# Patient Record
Sex: Male | Born: 1935 | Race: White | Hispanic: No | Smoking: Never smoker
Health system: Southern US, Community
[De-identification: ages and names within clinical notes are randomized; demographics above are authoritative.]

## PROBLEM LIST (undated history)

## (undated) DIAGNOSIS — E119 Type 2 diabetes mellitus without complications: Secondary | ICD-10-CM

## (undated) DIAGNOSIS — I739 Peripheral vascular disease, unspecified: Secondary | ICD-10-CM

## (undated) DIAGNOSIS — N289 Disorder of kidney and ureter, unspecified: Secondary | ICD-10-CM

## (undated) DIAGNOSIS — I10 Essential (primary) hypertension: Secondary | ICD-10-CM

## (undated) DIAGNOSIS — N184 Chronic kidney disease, stage 4 (severe): Secondary | ICD-10-CM

## (undated) HISTORY — PX: OTHER SURGICAL HISTORY: SHX169

---

## 2015-11-23 ENCOUNTER — Ambulatory Visit: Payer: Medicare Other

## 2015-11-23 ENCOUNTER — Encounter: Payer: Self-pay | Admitting: *Deleted

## 2015-11-23 ENCOUNTER — Emergency Department: Payer: Medicare Other

## 2015-11-23 ENCOUNTER — Emergency Department
Admission: EM | Admit: 2015-11-23 | Discharge: 2015-11-23 | Disposition: A | Discharge status: Home / Self Care | Payer: Medicare Other | Attending: Student | Admitting: Student

## 2015-11-23 DIAGNOSIS — R197 Diarrhea, unspecified: Secondary | ICD-10-CM | POA: Diagnosis not present

## 2015-11-23 DIAGNOSIS — Z7984 Long term (current) use of oral hypoglycemic drugs: Secondary | ICD-10-CM | POA: Diagnosis not present

## 2015-11-23 DIAGNOSIS — Z79899 Other long term (current) drug therapy: Secondary | ICD-10-CM | POA: Diagnosis not present

## 2015-11-23 DIAGNOSIS — Z794 Long term (current) use of insulin: Secondary | ICD-10-CM | POA: Insufficient documentation

## 2015-11-23 DIAGNOSIS — R109 Unspecified abdominal pain: Secondary | ICD-10-CM | POA: Diagnosis present

## 2015-11-23 DIAGNOSIS — R112 Nausea with vomiting, unspecified: Secondary | ICD-10-CM | POA: Insufficient documentation

## 2015-11-23 DIAGNOSIS — I10 Essential (primary) hypertension: Secondary | ICD-10-CM | POA: Insufficient documentation

## 2015-11-23 DIAGNOSIS — I739 Peripheral vascular disease, unspecified: Secondary | ICD-10-CM | POA: Insufficient documentation

## 2015-11-23 DIAGNOSIS — E119 Type 2 diabetes mellitus without complications: Secondary | ICD-10-CM | POA: Diagnosis not present

## 2015-11-23 DIAGNOSIS — R1110 Vomiting, unspecified: Secondary | ICD-10-CM

## 2015-11-23 HISTORY — DX: Type 2 diabetes mellitus without complications: E11.9

## 2015-11-23 HISTORY — DX: Essential (primary) hypertension: I10

## 2015-11-23 HISTORY — DX: Disorder of kidney and ureter, unspecified: N28.9

## 2015-11-23 LAB — URINALYSIS COMPLETE WITH MICROSCOPIC (ARMC ONLY)
Bilirubin Urine: NEGATIVE
GLUCOSE, UA: 50 mg/dL — AB
Ketones, ur: NEGATIVE mg/dL
LEUKOCYTES UA: NEGATIVE
Nitrite: NEGATIVE
Protein, ur: >500 mg/dL — AB
SQUAMOUS EPITHELIAL / LPF: NONE SEEN
Specific Gravity, Urine: 1.013 (ref 1.005–1.030)
pH: 5 (ref 5.0–8.0)

## 2015-11-23 LAB — CBC WITH DIFFERENTIAL/PLATELET
BASOS ABS: 0 10*3/uL (ref 0–0.1)
Basophils Relative: 0 %
Eosinophils Absolute: 0.2 10*3/uL (ref 0–0.7)
Eosinophils Relative: 2 %
HEMATOCRIT: 30.2 % — AB (ref 40.0–52.0)
Hemoglobin: 10 g/dL — ABNORMAL LOW (ref 13.0–18.0)
LYMPHS ABS: 1.3 10*3/uL (ref 1.0–3.6)
MCH: 29.2 pg (ref 26.0–34.0)
MCHC: 33 g/dL (ref 32.0–36.0)
MCV: 88.4 fL (ref 80.0–100.0)
MONO ABS: 1.7 10*3/uL — AB (ref 0.2–1.0)
Monocytes Relative: 13 %
NEUTROS ABS: 9.5 10*3/uL — AB (ref 1.4–6.5)
Neutrophils Relative %: 75 %
Platelets: 353 10*3/uL (ref 150–440)
RBC: 3.41 MIL/uL — AB (ref 4.40–5.90)
RDW: 12.8 % (ref 11.5–14.5)
WBC: 12.8 10*3/uL — AB (ref 3.8–10.6)

## 2015-11-23 LAB — COMPREHENSIVE METABOLIC PANEL
ALT: 13 U/L — AB (ref 17–63)
AST: 21 U/L (ref 15–41)
Albumin: 3 g/dL — ABNORMAL LOW (ref 3.5–5.0)
Alkaline Phosphatase: 69 U/L (ref 38–126)
Anion gap: 9 (ref 5–15)
BILIRUBIN TOTAL: 0.4 mg/dL (ref 0.3–1.2)
BUN: 46 mg/dL — AB (ref 6–20)
CALCIUM: 7.4 mg/dL — AB (ref 8.9–10.3)
CO2: 21 mmol/L — ABNORMAL LOW (ref 22–32)
CREATININE: 3.01 mg/dL — AB (ref 0.61–1.24)
Chloride: 112 mmol/L — ABNORMAL HIGH (ref 101–111)
GFR calc Af Amer: 20 mL/min — ABNORMAL LOW (ref >60)
GFR, EST NON AFRICAN AMERICAN: 17 mL/min — AB (ref >60)
Glucose, Bld: 72 mg/dL (ref 65–99)
Potassium: 4.1 mmol/L (ref 3.5–5.1)
Sodium: 142 mmol/L (ref 135–145)
TOTAL PROTEIN: 6.3 g/dL — AB (ref 6.5–8.1)

## 2015-11-23 LAB — LIPASE, BLOOD: LIPASE: 34 U/L (ref 11–51)

## 2015-11-23 LAB — TROPONIN I: Troponin I: <0.03 ng/mL (ref <0.031)

## 2015-11-23 MED ORDER — ONDANSETRON HCL 4 MG/2ML IJ SOLN
4.0000 mg | Freq: Once | INTRAMUSCULAR | Status: AC
  Administered 2015-11-23: 4 mg via INTRAVENOUS
  Filled 2015-11-23: qty 2

## 2015-11-23 MED ORDER — SODIUM CHLORIDE 0.9 % IV BOLUS (SEPSIS)
500.0000 mL | Freq: Once | INTRAVENOUS | Status: AC
  Administered 2015-11-23: 500 mL via INTRAVENOUS

## 2015-11-23 MED ORDER — DIATRIZOATE MEGLUMINE & SODIUM 66-10 % PO SOLN
30.0000 mL | Freq: Once | ORAL | Status: AC
  Administered 2015-11-23: 30 mL via ORAL

## 2015-11-23 MED ORDER — ONDANSETRON 4 MG PO TBDP: 4.0000 mg | ORAL_TABLET | Freq: Three times a day (TID) | ORAL | Status: DC | PRN

## 2015-11-23 NOTE — ED Notes (Signed)
Pt [facility]. Pt made aware and verbalized understanding. [facility] contacted and made aware.

## 2015-11-23 NOTE — ED Provider Notes (Signed)
[facility] Emergency Department Provider Note   ____________________________________________  Time seen: Approximately 5:15 PM  I have reviewed the triage vital signs and the nursing notes.   HISTORY  Chief Complaint Diarrhea and Emesis    HPI Rodney Valencia is a 82 y.o. male with diabetes, hypertension, coronary artery disease, stage IV chronic kidney disease who presents for evaluation of recurrent nonbloody nonbilious emesis and nonbloody diarrhea today, gradual onset, constant, no modifying factors, currently severe. He has had some abdominal cramping. No chest pain or difficulty breathing, no fevers. No dysuria. No coughing, sneezing, runny nose, congestion.   Past Medical History  Diagnosis Date  . Diabetes mellitus without complication (HCC)   . Hypertension   . Renal disorder     There are no active problems to display for this patient.   History reviewed. No pertinent past surgical history.  Current Outpatient Rx  Name  Route  Sig  Dispense  Refill  . amLODipine (NORVASC) 10 MG tablet   Oral   Take 10 mg by mouth daily.         Marland Kitchen. atorvastatin (LIPITOR) 10 MG tablet   Oral   Take 10 mg by mouth at bedtime.         . cholecalciferol (VITAMIN D) 1000 units tablet   Oral   Take 1,000 Units by mouth daily.         Marland Kitchen. docusate sodium (COLACE) 100 MG capsule   Oral   Take 100 mg by mouth 2 (two) times daily.         Marland Kitchen. gabapentin (NEURONTIN) 100 MG capsule   Oral   Take 200 mg by mouth 2 (two) times daily.         Marland Kitchen. glipiZIDE (GLUCOTROL XL) 10 MG 24 hr tablet   Oral   Take 10 mg by mouth 2 (two) times daily.         . insulin glargine (LANTUS) 100 UNIT/ML injection   Subcutaneous   Inject 5 Units into the skin daily.         . meclizine (ANTIVERT) 25 MG tablet   Oral   Take 25 mg by mouth every 8 (eight) hours as needed for dizziness.         . metoprolol succinate (TOPROL-XL) 25 MG 24 hr tablet   Oral  Take 25 mg by mouth daily.         Marland Kitchen. tolterodine (DETROL) 2 MG tablet   Oral   Take 2 mg by mouth 2 (two) times daily.         Marland Kitchen. zolpidem (AMBIEN) 10 MG tablet   Oral   Take 5 mg by mouth at bedtime as needed for sleep.           Allergies Review of patient's allergies indicates no known allergies.  History reviewed. No pertinent family history.  Social History Social History  Substance Use Topics  . Smoking status: Never Smoker   . Smokeless tobacco: None  . Alcohol Use: No    Review of Systems Constitutional: No fever/chills Eyes: No visual changes. ENT: No sore throat. Cardiovascular: Denies chest pain. Respiratory: Denies shortness of breath. Gastrointestinal: + abdominal cramping.  + nausea, + vomiting.  + diarrhea.  No constipation. Genitourinary: Negative for dysuria. Musculoskeletal: Negative for back pain. Skin: Negative for rash. Neurological: Negative for headaches, focal weakness or numbness.  10-point ROS otherwise negative.  ____________________________________________   PHYSICAL EXAM:  Filed Vitals:   11/23/15 1705 11/23/15  1904 11/23/15 2036  BP: 159/74 148/68 137/55  Pulse: 67 71 68  Temp: 99.9 F (37.7 C)    TempSrc: Oral    Resp: SpO2: 95% 98% 96%    VITAL SIGNS: ED Triage Vitals  Enc Vitals Group     BP --      Pulse --      Resp --      Temp --      Temp src --      SpO2 --      Weight --      Height --      Head Cir --      Peak Flow --      Pain Score --      Pain Loc --      Pain Edu? --      Excl. in GC? --     Constitutional: Alert and oriented. Nontoxic appearing and in no acute distress. Eyes: Conjunctivae are normal. PERRL. EOMI. Head: Atraumatic. Nose: No congestion/rhinnorhea. Mouth/Throat: Mucous membranes are moist.  Oropharynx non-erythematous. Neck: No stridor. Supple without meningismus. Cardiovascular: Normal rate, regular rhythm. Grossly normal heart sounds.  Good peripheral  circulation. Respiratory: Normal respiratory effort.  No retractions. Lungs CTAB. Gastrointestinal: Soft and nontender. No distention.  No CVA tenderness. Genitourinary: deferred Musculoskeletal: No lower extremity tenderness nor edema.  No joint effusions. Neurologic:  Normal speech and language. No gross focal neurologic deficits are appreciated. Skin:  Skin is warm, dry and intact. No rash noted. Psychiatric: Mood and affect are normal. Speech and behavior are normal.  ____________________________________________   LABS (all labs ordered are listed, but only abnormal results are displayed)  Labs Reviewed  CBC WITH DIFFERENTIAL/PLATELET - Abnormal; Notable for the following:    WBC 12.8 (*)    RBC 3.41 (*)    Hemoglobin 10.0 (*)    HCT 30.2 (*)    Neutro Abs 9.5 (*)    Monocytes Absolute 1.7 (*)    All other components within normal limits  COMPREHENSIVE METABOLIC PANEL - Abnormal; Notable for the following:    Chloride 112 (*)    CO2 21 (*)    BUN 46 (*)    Creatinine, Ser 3.01 (*)    Calcium 7.4 (*)    Total Protein 6.3 (*)    Albumin 3.0 (*)    ALT 13 (*)    GFR calc non Af Amer 17 (*)    GFR calc Af Amer 20 (*)    All other components within normal limits  URINALYSIS COMPLETEWITH MICROSCOPIC ([facility] ONLY) - Abnormal; Notable for the following:    Color, Urine YELLOW (*)    APPearance CLEAR (*)    Glucose, UA 50 (*)    Hgb urine dipstick 1+ (*)    Protein, ur >500 (*)    Bacteria, UA FEW (*)    All other components within normal limits  LIPASE, BLOOD  TROPONIN I   ____________________________________________  EKG  ED ECG REPORT I, Gayla Doss, the attending physician, personally viewed and interpreted this ECG.   Date: 11/23/2015  EKG Time: 17:26  Rate: 67  Rhythm: Normal sinus rhythm  Axis: noemal  Intervals:none  ST&T Change: No acute ST elevation. LVH with repolarization  abnormality.  ____________________________________________  RADIOLOGY  CT abdomen and pelvis IMPRESSION: No acute abnormality abdomen or pelvis. No finding to explain the patient's symptoms.  Extensive calcific aortic and coronary atherosclerosis.  Small gravel type gallstones without evidence of  cholecystitis.  Mild diverticulosis without diverticulitis.  Findings consistent with old granulomatous disease. ____________________________________________   PROCEDURES  Procedure(s) performed: None  Critical Care performed: No  ____________________________________________   INITIAL IMPRESSION / ASSESSMENT AND PLAN / ED COURSE  Pertinent labs & imaging results that were available during my care of the patient were reviewed by me and considered in my medical decision making (see chart for details).  Rodney Valencia is a 82 y.o. male with diabetes, hypertension, coronary artery disease, stage IV chronic kidney disease who presents for evaluation of recurrent nonbloody nonbilious emesis and nonbloody diarrhea today. On exam, he is nontoxic appearing and in no acute distress. ABLE to really is afebrile. He has a benign abdominal examination. Symptoms may be secondary to viral gastroenteritis however given his advanced age, we'll obtain screening labs, urinalysis, likely CT of the abdomen and pelvis. We'll treat him symptomatically with IV fluids and antiemetics.  ----------------------------------------- 8:39 PM on 11/23/2015 ----------------------------------------- Labs reviewed, CBC was notable for mild leukocytosis, mild anemia with hemoglobin 10.0. No prior labs are available for comparison. CMP shows creatinine elevation at 3.01 however the patient does have known stage IV kidney disease and I suspect this is chronic. His corrected calcium accounting for his albumin is 8.2. Normal lipase, negative troponin. Urinalysis is not consistent with infection. He reports he feels well, and  is requesting discharge. CT of the abdomen and pelvis shows no acute abnormality. DC with return precautions and close PCP follow-up. He is comfortable with the discharge plan and is tolerating by mouth intake without vomiting in the emergency department ____________________________________________   FINAL CLINICAL IMPRESSION(S) / ED DIAGNOSES  Final diagnoses:  Vomiting and diarrhea      NEW MEDICATIONS STARTED DURING THIS VISIT:  New Prescriptions   No medications on file     Note:  This document was prepared using Dragon voice recognition software and may include unintentional dictation errors.    Gayla Doss, MD 11/23/15 2106

## 2015-11-23 NOTE — ED Notes (Signed)
Pt returned from CT at this time.  

## 2015-11-23 NOTE — ED Notes (Signed)
Pt cleaned up, given new brief and new pants. Dressed and awaiting on EMS transport home to [facility]. NAD noted, verbalized no further needs at this time

## 2015-11-23 NOTE — ED Notes (Signed)
[facility] with n/v/d that began x 2 hours ago. Pt denies any pain, SOB, or chest pain. Upon arrival, pt AAOx4, vitals stable, NAD noted. MD Inocencio HomesGayle at bedside upon arrival.

## 2015-11-23 NOTE — ED Notes (Signed)
EMS arrived at this time for pt transport. Pt stable, NAD noted.

## 2015-11-23 NOTE — ED Notes (Signed)
MD Gayle at bedside. 

## 2015-11-23 NOTE — ED Notes (Signed)
[facility] contacted, unable to provide transport back to facility. Will require EMS.

## 2015-11-26 ENCOUNTER — Emergency Department: Payer: Medicare Other

## 2015-11-26 ENCOUNTER — Emergency Department
Admission: EM | Admit: 2015-11-26 | Discharge: 2015-11-26 | Disposition: A | Discharge status: Home / Self Care | Payer: Medicare Other | Attending: Emergency Medicine | Admitting: Emergency Medicine

## 2015-11-26 ENCOUNTER — Encounter: Payer: Self-pay | Admitting: Emergency Medicine

## 2015-11-26 DIAGNOSIS — M79602 Pain in left arm: Secondary | ICD-10-CM | POA: Diagnosis present

## 2015-11-26 DIAGNOSIS — Z79899 Other long term (current) drug therapy: Secondary | ICD-10-CM | POA: Insufficient documentation

## 2015-11-26 DIAGNOSIS — S0093XA Contusion of unspecified part of head, initial encounter: Secondary | ICD-10-CM | POA: Insufficient documentation

## 2015-11-26 DIAGNOSIS — Z794 Long term (current) use of insulin: Secondary | ICD-10-CM | POA: Insufficient documentation

## 2015-11-26 DIAGNOSIS — E119 Type 2 diabetes mellitus without complications: Secondary | ICD-10-CM | POA: Insufficient documentation

## 2015-11-26 DIAGNOSIS — S0990XA Unspecified injury of head, initial encounter: Secondary | ICD-10-CM | POA: Diagnosis not present

## 2015-11-26 DIAGNOSIS — Y929 Unspecified place or not applicable: Secondary | ICD-10-CM | POA: Insufficient documentation

## 2015-11-26 DIAGNOSIS — Z23 Encounter for immunization: Secondary | ICD-10-CM | POA: Insufficient documentation

## 2015-11-26 DIAGNOSIS — W01198A Fall on same level from slipping, tripping and stumbling with subsequent striking against other object, initial encounter: Secondary | ICD-10-CM | POA: Diagnosis not present

## 2015-11-26 DIAGNOSIS — I10 Essential (primary) hypertension: Secondary | ICD-10-CM | POA: Diagnosis not present

## 2015-11-26 DIAGNOSIS — T148XXA Other injury of unspecified body region, initial encounter: Secondary | ICD-10-CM

## 2015-11-26 DIAGNOSIS — Y9389 Activity, other specified: Secondary | ICD-10-CM | POA: Diagnosis not present

## 2015-11-26 DIAGNOSIS — Y999 Unspecified external cause status: Secondary | ICD-10-CM | POA: Diagnosis not present

## 2015-11-26 HISTORY — DX: Peripheral vascular disease, unspecified: I73.9

## 2015-11-26 MED ORDER — BACITRACIN ZINC 500 UNIT/GM EX OINT
TOPICAL_OINTMENT | CUTANEOUS | Status: AC | PRN
  Administered 2015-11-26: 1 via TOPICAL

## 2015-11-26 MED ORDER — BACITRACIN ZINC 500 UNIT/GM EX OINT
TOPICAL_OINTMENT | CUTANEOUS | Status: AC
  Administered 2015-11-26: 1 via TOPICAL
  Filled 2015-11-26: qty 0.9

## 2015-11-26 MED ORDER — TETANUS-DIPHTH-ACELL PERTUSSIS 5-2.5-18.5 LF-MCG/0.5 IM SUSP
0.5000 mL | Freq: Once | INTRAMUSCULAR | Status: AC
  Administered 2015-11-26: 0.5 mL via INTRAMUSCULAR
  Filled 2015-11-26: qty 0.5

## 2015-11-26 NOTE — ED Notes (Signed)
[facility] present to [facility].

## 2015-11-26 NOTE — ED Notes (Addendum)
89 yom PMHx DM, HTN, CKD (No HD), PVD presents via EMS for c/c Left arm and back pain after a ground level fall yesterday. States he was eating dinner, went to get up from the table and his foot  'got snagged on a chair' causing him to trip and fall. NO LOC.   [facility] [phone]

## 2015-11-26 NOTE — ED Notes (Deleted)
Discussed discharge instructions and follow-up care with patient. No questions or concerns at this time. Pt stable at discharge. [facility].

## 2015-11-26 NOTE — ED Provider Notes (Signed)
[facility] Emergency Department Provider Note   ____________________________________________  Time seen: Approximately 11:50 AM  I have reviewed the triage vital signs and the nursing notes.   HISTORY  Chief Complaint Fall   HPI Rodney Valencia is a 82 y.o. male patient reports she was getting up from dinner table tripped and fell when he caught his foot. He said he fell and hit his head on the wall and his head hurts and his neck hurts back hurts his left elbow hurts and his knee hurts also on the left. On examination the worst pain seems to be in the patient's knee. Patient denies any other medical problems and no his past history shows hypertension diabetes and peripheral vascular disease. Patient is very sparse in his words use pain appears to be moderate.   Past Medical History  Diagnosis Date  . Diabetes mellitus without complication (HCC)   . Hypertension   . Renal disorder   . PVD (peripheral vascular disease) (HCC)     There are no active problems to display for this patient.   History reviewed. No pertinent past surgical history.  Current Outpatient Rx  Name  Route  Sig  Dispense  Refill  . amLODipine (NORVASC) 10 MG tablet   Oral   Take 10 mg by mouth daily.         Marland Kitchen atorvastatin (LIPITOR) 10 MG tablet   Oral   Take 10 mg by mouth at bedtime.         . cholecalciferol (VITAMIN D) 1000 units tablet   Oral   Take 1,000 Units by mouth daily.         Marland Kitchen docusate sodium (COLACE) 100 MG capsule   Oral   Take 100 mg by mouth 2 (two) times daily.         Marland Kitchen gabapentin (NEURONTIN) 100 MG capsule   Oral   Take 200 mg by mouth 2 (two) times daily.         Marland Kitchen glipiZIDE (GLUCOTROL XL) 10 MG 24 hr tablet   Oral   Take 10 mg by mouth 2 (two) times daily.         . insulin glargine (LANTUS) 100 UNIT/ML injection   Subcutaneous   Inject 5 Units into the skin daily.         . meclizine (ANTIVERT) 25 MG tablet   Oral   Take  25 mg by mouth every 8 (eight) hours as needed for dizziness.         . metoprolol succinate (TOPROL-XL) 25 MG 24 hr tablet   Oral   Take 25 mg by mouth daily.         . ondansetron (ZOFRAN ODT) 4 MG disintegrating tablet   Oral   Take 1 tablet (4 mg total) by mouth every 8 (eight) hours as needed for nausea or vomiting.   6 tablet   0   . tolterodine (DETROL) 2 MG tablet   Oral   Take 2 mg by mouth 2 (two) times daily.         Marland Kitchen zolpidem (AMBIEN) 10 MG tablet   Oral   Take 5 mg by mouth at bedtime as needed for sleep.           Allergies Review of patient's allergies indicates no known allergies.  History reviewed. No pertinent family history.  Social History Social History  Substance Use Topics  . Smoking status: Never Smoker   . Smokeless tobacco: None  .  Alcohol Use: No    Review of Systems Constitutional: No fever/chills Eyes: No visual changes. ENT: No sore throat. Cardiovascular: Denies chest pain. Respiratory: Denies shortness of breath. Gastrointestinal: No abdominal pain.  No nausea, no vomiting.  No diarrhea.  No constipation. Genitourinary: Negative for dysuria. Musculoskeletal: Negative for back pain. Skin: Negative for rash. Neurological: Negative for headaches, focal weakness or numbness.  10-point ROS otherwise negative.  ____________________________________________   PHYSICAL EXAM:  VITAL SIGNS: ED Triage Vitals  Enc Vitals Group     BP 11/26/15 1043 156/60 mmHg     Pulse Rate 11/26/15 1043 73     Resp 11/26/15 1043 18     Temp 11/26/15 1043 98.4 F (36.9 C)     Temp Source 11/26/15 1043 Oral     SpO2 11/26/15 1043 97 %     Weight 11/26/15 1044 159 lb (72.122 kg)     Height 11/26/15 1044  (1.778 m)     Head Cir --      Peak Flow --      Pain Score 11/26/15 1043 5     Pain Loc --      Pain Edu? --      Excl. in GC? --     Constitutional: Alert and oriented. Well appearing and in no acute distress. Eyes:  Conjunctivae are normal. PERRL. EOMI. Head: Atraumatic. Nose: No congestion/rhinnorhea. Mouth/Throat: Mucous membranes are moist.  Oropharynx non-erythematous. Neck: No stridor.  Mild diffuse cervical spine tenderness to palpation Cardiovascular: Normal rate, regular rhythm. Grossly normal heart sounds.  Good peripheral circulation. Respiratory: Normal respiratory effort.  No retractions. Lungs CTAB. Gastrointestinal: Soft and nontender. No distention. No abdominal bruits. No CVA tenderness. Musculoskeletal: Left knee is tender and patient complains of pain when I attempt to move it however it is not bruised or swollen there is no crepitus everything seems to be in the proper position. Patient also complains of pain in the left arm but he is using the left arm to hold his drink and moves it freely without any difficulty. She complains of pain on palpation of his spine more in the lower part of the spine and lumbar spine Neurologic:  Normal speech and language. No gross focal neurologic deficits are appreciated.  Skin:  Skin is warm, dry and intact. No rash noted. Psychiatric: Mood and affect are normal. Speech and behavior are normal.  ____________________________________________   LABS (all labs ordered are listed, but only abnormal results are displayed)  Labs Reviewed - No data to display ____________________________________________  EKG   ____________________________________________  RADIOLOGY  EXAM: CT HEAD WITHOUT CONTRAST  CT CERVICAL SPINE WITHOUT CONTRAST  TECHNIQUE: Multidetector CT imaging of the head and cervical spine was performed following the standard protocol without intravenous contrast. Multiplanar CT image reconstructions of the cervical spine were also generated.  COMPARISON: None.  FINDINGS: CT HEAD FINDINGS  There is opacification of the superior right maxillary sinus and mild mucosal thickening in the superior left maxillary sinus.  Mild mucosal thickening in the ethmoid sinuses as well. No air-fluid levels or other sinus disease identified. Mastoid air cells and middle ears are well aerated. The right mandibular head sits somewhat laterally at the TMJ joint relative to the left. This could be positioning versus subluxation. Recommend clinical correlation new this region. Remainder of the bones are within normal limits. Extracranial soft tissues are within normal limits. No subdural, epidural, or subarachnoid hemorrhage identified. Ventricles and sulci are prominent, likely due to atrophy. No mass,  mass effect, or midline shift. Scattered white matter changes are seen. A subcortical lacunar infarct is seen in the posterior right corona radiata on image 22. No acute cortical ischemia or infarct is identified on today's study. The cerebellum, brainstem, and basal cisterns are normal.  CT CERVICAL SPINE FINDINGS  There is 2 mm of anterior listhesis of C2 versus C3. No other significant malalignment. No prevertebral soft tissue swelling. The pre odontoid space is normal. No fractures are seen. Multilevel degenerative changes are seen. There is fusion of the posterior C4 and C5 vertebral bodies. There is osteophytosis at multiple disc levels with a posterior osteophytes seen at C4-5 and another seen at C6-7. Degenerative changes are also seen in the facets. There is a prominent posterior disc bulge that C5-6 with flattening of the thecal sac and narrowing of the canal to 10 mm. No other abnormalities.  IMPRESSION: 1. No acute intracranial process. 2. 2 mm of anterior listhesis of C2 versus C3 with no underlying fracture identified. This is favored to be due to the facet degenerative changes at this level. 3. Prominent disc bulge at C5-6 with flattening of the anterior thecal sac and narrowing of the canal to 10 mm. 4. The right mandibular head is mildly lateral compared to the TMJ. Whether this is positional  or due to mild subluxation is unclear on provided images. Recommend clinical correlation. 5. Mild sinus disease.   Electronically Signed  By: Gerome Samavid Williams III M.D  On: 11/26/2015 13:25   X-rays of the thoracic and lumbar spine elbow and knee on the left side are all read as negative by radiology ____________________________________________   PROCEDURES  CT reviewed patient's neck pain is diffuse is no focal pain there is no logical findings also he has no jaw pain and did not hit his jaw.    ____________________________________________   INITIAL IMPRESSION / ASSESSMENT AND PLAN / ED COURSE  Pertinent labs & imaging results that were available during my care of the patient were reviewed by me and considered in my medical decision making (see chart for details).   ____________________________________________   FINAL CLINICAL IMPRESSION(S) / ED DIAGNOSES  Final diagnoses:  Contusion      NEW MEDICATIONS STARTED DURING THIS VISIT:  Discharge Medication List as of 11/26/2015  2:31 PM       Note:  This document was prepared using Dragon voice recognition software and may include unintentional dictation errors.    Arnaldo NatalPaul F Malinda, MD 11/26/15 2122

## 2015-11-26 NOTE — ED Notes (Signed)
Discussed discharge instructions and follow-up care with patient. No questions or concerns at this time. Pt stable at discharge.  

## 2015-11-26 NOTE — ED Notes (Signed)
[facility] Kristen and updated on pt's status.

## 2016-02-27 ENCOUNTER — Emergency Department: Payer: Medicare Other

## 2016-02-27 ENCOUNTER — Inpatient Hospital Stay
Admission: EM | Admit: 2016-02-27 | Discharge: 2016-03-04 | DRG: 202 | Disposition: A | Discharge status: Home Health | Payer: Medicare Other | Attending: Internal Medicine | Admitting: Internal Medicine

## 2016-02-27 DIAGNOSIS — R4182 Altered mental status, unspecified: Secondary | ICD-10-CM

## 2016-02-27 DIAGNOSIS — I129 Hypertensive chronic kidney disease with stage 1 through stage 4 chronic kidney disease, or unspecified chronic kidney disease: Secondary | ICD-10-CM | POA: Diagnosis present

## 2016-02-27 DIAGNOSIS — I959 Hypotension, unspecified: Secondary | ICD-10-CM | POA: Diagnosis present

## 2016-02-27 DIAGNOSIS — E1122 Type 2 diabetes mellitus with diabetic chronic kidney disease: Secondary | ICD-10-CM | POA: Diagnosis present

## 2016-02-27 DIAGNOSIS — R6510 Systemic inflammatory response syndrome (SIRS) of non-infectious origin without acute organ dysfunction: Secondary | ICD-10-CM | POA: Diagnosis present

## 2016-02-27 DIAGNOSIS — J45909 Unspecified asthma, uncomplicated: Secondary | ICD-10-CM | POA: Diagnosis present

## 2016-02-27 DIAGNOSIS — Z79899 Other long term (current) drug therapy: Secondary | ICD-10-CM

## 2016-02-27 DIAGNOSIS — N184 Chronic kidney disease, stage 4 (severe): Secondary | ICD-10-CM | POA: Diagnosis present

## 2016-02-27 DIAGNOSIS — Z66 Do not resuscitate: Secondary | ICD-10-CM | POA: Diagnosis present

## 2016-02-27 DIAGNOSIS — E8770 Fluid overload, unspecified: Secondary | ICD-10-CM | POA: Diagnosis not present

## 2016-02-27 DIAGNOSIS — Y95 Nosocomial condition: Secondary | ICD-10-CM | POA: Diagnosis present

## 2016-02-27 DIAGNOSIS — N179 Acute kidney failure, unspecified: Secondary | ICD-10-CM | POA: Diagnosis present

## 2016-02-27 DIAGNOSIS — D638 Anemia in other chronic diseases classified elsewhere: Secondary | ICD-10-CM | POA: Diagnosis present

## 2016-02-27 DIAGNOSIS — K219 Gastro-esophageal reflux disease without esophagitis: Secondary | ICD-10-CM | POA: Diagnosis present

## 2016-02-27 DIAGNOSIS — Z794 Long term (current) use of insulin: Secondary | ICD-10-CM

## 2016-02-27 DIAGNOSIS — E785 Hyperlipidemia, unspecified: Secondary | ICD-10-CM | POA: Diagnosis present

## 2016-02-27 DIAGNOSIS — J209 Acute bronchitis, unspecified: Principal | ICD-10-CM | POA: Diagnosis present

## 2016-02-27 DIAGNOSIS — E872 Acidosis: Secondary | ICD-10-CM | POA: Diagnosis present

## 2016-02-27 DIAGNOSIS — J189 Pneumonia, unspecified organism: Secondary | ICD-10-CM

## 2016-02-27 DIAGNOSIS — E11649 Type 2 diabetes mellitus with hypoglycemia without coma: Secondary | ICD-10-CM | POA: Diagnosis present

## 2016-02-27 DIAGNOSIS — N400 Enlarged prostate without lower urinary tract symptoms: Secondary | ICD-10-CM | POA: Diagnosis present

## 2016-02-27 DIAGNOSIS — G9340 Encephalopathy, unspecified: Secondary | ICD-10-CM | POA: Diagnosis present

## 2016-02-27 DIAGNOSIS — I739 Peripheral vascular disease, unspecified: Secondary | ICD-10-CM | POA: Diagnosis present

## 2016-02-27 DIAGNOSIS — H9190 Unspecified hearing loss, unspecified ear: Secondary | ICD-10-CM | POA: Diagnosis present

## 2016-02-27 DIAGNOSIS — E1121 Type 2 diabetes mellitus with diabetic nephropathy: Secondary | ICD-10-CM | POA: Diagnosis present

## 2016-02-27 DIAGNOSIS — R509 Fever, unspecified: Secondary | ICD-10-CM

## 2016-02-27 DIAGNOSIS — F0390 Unspecified dementia without behavioral disturbance: Secondary | ICD-10-CM | POA: Diagnosis present

## 2016-02-27 DIAGNOSIS — Z7982 Long term (current) use of aspirin: Secondary | ICD-10-CM

## 2016-02-27 HISTORY — DX: Chronic kidney disease, stage 4 (severe): N18.4

## 2016-02-27 LAB — CBC WITH DIFFERENTIAL/PLATELET
Basophils Absolute: 0.1 10*3/uL (ref 0–0.1)
Basophils Relative: 1 %
EOS ABS: 0.3 10*3/uL (ref 0–0.7)
EOS PCT: 3 %
HCT: 28.7 % — ABNORMAL LOW (ref 40.0–52.0)
HEMOGLOBIN: 9.8 g/dL — AB (ref 13.0–18.0)
LYMPHS ABS: 1.2 10*3/uL (ref 1.0–3.6)
LYMPHS PCT: 12 %
MCH: 30.4 pg (ref 26.0–34.0)
MCHC: 34.2 g/dL (ref 32.0–36.0)
MCV: 88.9 fL (ref 80.0–100.0)
MONOS PCT: 10 %
Monocytes Absolute: 1 10*3/uL (ref 0.2–1.0)
Neutro Abs: 7.3 10*3/uL — ABNORMAL HIGH (ref 1.4–6.5)
Neutrophils Relative %: 74 %
PLATELETS: 199 10*3/uL (ref 150–440)
RBC: 3.23 MIL/uL — ABNORMAL LOW (ref 4.40–5.90)
RDW: 14.2 % (ref 11.5–14.5)
WBC: 9.9 10*3/uL (ref 3.8–10.6)

## 2016-02-27 LAB — COMPREHENSIVE METABOLIC PANEL
ALBUMIN: 3.5 g/dL (ref 3.5–5.0)
ALT: 15 U/L — AB (ref 17–63)
AST: 20 U/L (ref 15–41)
Alkaline Phosphatase: 86 U/L (ref 38–126)
Anion gap: 5 (ref 5–15)
BUN: 52 mg/dL — AB (ref 6–20)
CHLORIDE: 110 mmol/L (ref 101–111)
CO2: 23 mmol/L (ref 22–32)
CREATININE: 3.61 mg/dL — AB (ref 0.61–1.24)
Calcium: 7.9 mg/dL — ABNORMAL LOW (ref 8.9–10.3)
GFR calc Af Amer: 16 mL/min — ABNORMAL LOW (ref >60)
GFR calc non Af Amer: 14 mL/min — ABNORMAL LOW (ref >60)
GLUCOSE: 153 mg/dL — AB (ref 65–99)
POTASSIUM: 4.8 mmol/L (ref 3.5–5.1)
SODIUM: 138 mmol/L (ref 135–145)
Total Bilirubin: 0.3 mg/dL (ref 0.3–1.2)
Total Protein: 5.9 g/dL — ABNORMAL LOW (ref 6.5–8.1)

## 2016-02-27 LAB — URINALYSIS COMPLETE WITH MICROSCOPIC (ARMC ONLY)
Bilirubin Urine: NEGATIVE
Glucose, UA: 50 mg/dL — AB
Hgb urine dipstick: NEGATIVE
KETONES UR: NEGATIVE mg/dL
Leukocytes, UA: NEGATIVE
Nitrite: NEGATIVE
PH: 5 (ref 5.0–8.0)
PROTEIN: 100 mg/dL — AB
SQUAMOUS EPITHELIAL / LPF: NONE SEEN
Specific Gravity, Urine: 1.014 (ref 1.005–1.030)

## 2016-02-27 LAB — LACTIC ACID, PLASMA: LACTIC ACID, VENOUS: 1.1 mmol/L (ref 0.5–1.9)

## 2016-02-27 MED ORDER — SODIUM CHLORIDE 0.9 % IV SOLN
1000.0000 mL | INTRAVENOUS | Status: DC
  Administered 2016-02-27: 1000 mL via INTRAVENOUS

## 2016-02-27 MED ORDER — ALBUTEROL SULFATE (2.5 MG/3ML) 0.083% IN NEBU
5.0000 mg | INHALATION_SOLUTION | Freq: Once | RESPIRATORY_TRACT | Status: AC
  Administered 2016-02-27: 5 mg via RESPIRATORY_TRACT
  Filled 2016-02-27: qty 6

## 2016-02-27 MED ORDER — SODIUM CHLORIDE 0.9 % IV BOLUS (SEPSIS)
500.0000 mL | Freq: Once | INTRAVENOUS | Status: AC
  Administered 2016-02-27: 500 mL via INTRAVENOUS

## 2016-02-27 MED ORDER — DEXTROSE 5 % IV SOLN
2.0000 g | Freq: Once | INTRAVENOUS | Status: AC
  Administered 2016-02-28: 2 g via INTRAVENOUS
  Filled 2016-02-27: qty 2

## 2016-02-27 MED ORDER — VANCOMYCIN HCL IN DEXTROSE 1-5 GM/200ML-% IV SOLN
1000.0000 mg | Freq: Once | INTRAVENOUS | Status: AC
  Administered 2016-02-27: 1000 mg via INTRAVENOUS
  Filled 2016-02-27: qty 200

## 2016-02-27 NOTE — ED Triage Notes (Signed)
[facility]/o fever and productive cough.  Per EMS, pt got temp of 103.0 F and gave unspecified amount of Tylenol.  EMS reports temp of 100.5 F.  Pt sts cough began today, denies SOB, CP, dizziness or LOC.  A/Ox4. NAD.

## 2016-02-27 NOTE — ED Provider Notes (Signed)
[facility] Emergency Department Provider Note    First MD Initiated Contact with Patient 02/27/16 2141     (approximate)  I have reviewed the triage vital signs and the nursing notes.   HISTORY  Chief Complaint Fever    HPI Fuller Canadaarl Mauceri is a 82 y.o. male history diabetes, high retention as well as stage IV chronic kidney disease presents for 1 day of fever, shortness of breath and productive cough. He shouldn't is arriving from med in assisted living. Patient feared 103 today and was given Tylenol with some improvement in temperature. He is sent to the ER for further evaluation and management. Patient denies any complaints at this time. No shortness of breath. On further questioning the patient is uncertain as to where he is and states that the year is "1700."  He does not know who the president is.  Per EMS the patient not had any nausea vomiting or complained of any abdominal pain. Review of nursing facility notes shows no recent antibiotic treatment.   Past Medical History:  Diagnosis Date  . Diabetes mellitus without complication (HCC)   . Hypertension   . PVD (peripheral vascular disease) (HCC)   . Renal disorder     There are no active problems to display for this patient.   History reviewed. No pertinent surgical history.  Prior to Admission medications   Medication Sig Start Date End Date Taking? Authorizing Provider  amLODipine (NORVASC) 10 MG tablet Take 10 mg by mouth daily.    Historical Provider, MD  atorvastatin (LIPITOR) 10 MG tablet Take 10 mg by mouth at bedtime.    Historical Provider, MD  cholecalciferol (VITAMIN D) 1000 units tablet Take 1,000 Units by mouth daily.    Historical Provider, MD  docusate sodium (COLACE) 100 MG capsule Take 100 mg by mouth 2 (two) times daily.    Historical Provider, MD  gabapentin (NEURONTIN) 100 MG capsule Take 200 mg by mouth 2 (two) times daily.    Historical Provider, MD  glipiZIDE (GLUCOTROL  XL) 10 MG 24 hr tablet Take 10 mg by mouth 2 (two) times daily.    Historical Provider, MD  insulin glargine (LANTUS) 100 UNIT/ML injection Inject 5 Units into the skin daily.    Historical Provider, MD  meclizine (ANTIVERT) 25 MG tablet Take 25 mg by mouth every 8 (eight) hours as needed for dizziness.    Historical Provider, MD  metoprolol succinate (TOPROL-XL) 25 MG 24 hr tablet Take 25 mg by mouth daily.    Historical Provider, MD  ondansetron (ZOFRAN ODT) 4 MG disintegrating tablet Take 1 tablet (4 mg total) by mouth every 8 (eight) hours as needed for nausea or vomiting. 11/23/15   Gayla DossEryka A Gayle, MD  tolterodine (DETROL) 2 MG tablet Take 2 mg by mouth 2 (two) times daily.    Historical Provider, MD  zolpidem (AMBIEN) 10 MG tablet Take 5 mg by mouth at bedtime as needed for sleep.    Historical Provider, MD    Allergies Review of patient's allergies indicates no known allergies.  FMH unable to be obtained due AMS  Social History Social History  Substance Use Topics  . Smoking status: Never Smoker  . Smokeless tobacco: Not on file  . Alcohol use No    Review of Systems Patient denies headaches, rhinorrhea, blurry vision, numbness, shortness of breath, chest pain, edema, cough, abdominal pain, nausea, vomiting, diarrhea, dysuria, fevers, rashes or hallucinations unless otherwise stated above in HPI. ____________________________________________  PHYSICAL EXAM:  VITAL SIGNS: Vitals:   02/27/16 2148 02/27/16 2159  BP: (!) 147/58   Pulse: 63   Resp: 16   Temp: 98.3 F (36.8 C) (!) 100.9 F (38.3 C)    Constitutional: Alert and oriented to person. Elderly and frail-appearing Eyes: Conjunctivae are normal. PERRL. EOMI. Head: Atraumatic. Nose: No congestion/rhinnorhea. Mouth/Throat: Mucous membranes are moist.  Oropharynx non-erythematous. Neck: No stridor. Painless ROM. No cervical spine tenderness to palpation Hematological/Lymphatic/Immunilogical: No cervical  lymphadenopathy. Cardiovascular: Normal rate, regular rhythm. Grossly normal heart sounds.  Good peripheral circulation. Respiratory: Normal respiratory effort.  No retractions. Lungs coarse bibasilar Bs, faint rhonchi in right posterior base. Gastrointestinal: Soft and nontender. No distention. No abdominal bruits. No CVA tenderness. Genitourinary:  Musculoskeletal: No lower extremity tenderness nor edema.  No joint effusions. Neurologic:  Normal speech and language. No gross focal neurologic deficits are appreciated. No facial droop Skin:  Skin is warm, dry and intact. No rash noted. Psychiatric: Mood and affect are normal. Speech and behavior are normal.  ____________________________________________   LABS (all labs ordered are listed, but only abnormal results are displayed)  Results for orders placed or performed during the hospital encounter of 02/27/16 (from the past 24 hour(s))  Comprehensive metabolic panel     Status: Abnormal   Collection Time: 02/27/16  9:54 PM  Result Value Ref Range   Sodium 138 135 - 145 mmol/L   Potassium 4.8 3.5 - 5.1 mmol/L   Chloride 110 101 - 111 mmol/L   CO2 23 22 - 32 mmol/L   Glucose, Bld 153 (H) 65 - 99 mg/dL   BUN 52 (H) 6 - 20 mg/dL   Creatinine, Ser 5.40 (H) 0.61 - 1.24 mg/dL   Calcium 7.9 (L) 8.9 - 10.3 mg/dL   Total Protein 5.9 (L) 6.5 - 8.1 g/dL   Albumin 3.5 3.5 - 5.0 g/dL   AST 20 15 - 41 U/L   ALT 15 (L) 17 - 63 U/L   Alkaline Phosphatase 86 38 - 126 U/L   Total Bilirubin 0.3 0.3 - 1.2 mg/dL   GFR calc non Af Amer 14 (L) >60 mL/min   GFR calc Af Amer 16 (L) >60 mL/min   Anion gap 5 5 - 15  CBC WITH DIFFERENTIAL     Status: Abnormal   Collection Time: 02/27/16  9:54 PM  Result Value Ref Range   WBC 9.9 3.8 - 10.6 K/uL   RBC 3.23 (L) 4.40 - 5.90 MIL/uL   Hemoglobin 9.8 (L) 13.0 - 18.0 g/dL   HCT 98.1 (L) 19.1 - 47.8 %   MCV 88.9 80.0 - 100.0 fL   MCH 30.4 26.0 - 34.0 pg   MCHC 34.2 32.0 - 36.0 g/dL   RDW 29.5 62.1 - 30.8 %     Platelets 199 150 - 440 K/uL   Neutrophils Relative % 74 %   Neutro Abs 7.3 (H) 1.4 - 6.5 K/uL   Lymphocytes Relative 12 %   Lymphs Abs 1.2 1.0 - 3.6 K/uL   Monocytes Relative 10 %   Monocytes Absolute 1.0 0.2 - 1.0 K/uL   Eosinophils Relative 3 %   Eosinophils Absolute 0.3 0 - 0.7 K/uL   Basophils Relative 1 %   Basophils Absolute 0.1 0 - 0.1 K/uL  Lactic acid, plasma     Status: None   Collection Time: 02/27/16  9:54 PM  Result Value Ref Range   Lactic Acid, Venous 1.1 0.5 - 1.9 mmol/L   ____________________________________________  EKG  My review and personal interpretation at Time: 21:47   Indication: SOB  Rate: 100  Rhythm: A-fib Axis: normal Other: nonspecific ST changes ____________________________________________  RADIOLOGY  CXR my read shows no evidence of acute cardiopulmonary process.  ____________________________________________   PROCEDURES  Procedure(s) performed: none    Critical Care performed: no ____________________________________________   INITIAL IMPRESSION / ASSESSMENT AND PLAN / ED COURSE  Pertinent labs & imaging results that were available during my care of the patient were reviewed by me and considered in my medical decision making (see chart for details).  DDX: Pneumonia, UTI, cholecystitis, enteritis, ACS, CHF  Fuller Canadaarl Snapp is a 82 y.o. who presents to the ED with report of fever, shortness of breath and productive cough 1 day. Patient arrives febrile and to. Does appear mildly dehydrated but does not have any tachycardia. His abdominal exam is soft and benign. Do not feel this is an acute intra-abdominal process. As the patient is from nursing facility with acute fever and productive cough and concern for pneumonia specifically HCAP.  We'll obtain a culture data as well as chest x-ray. Patient will be placed on monitor and observed in the ED.  Clinical Course  Comment By Time  Patient reassessed currently sleeping. He has tachypnea  to 19. No hypoxia.  Repeat abdominal exam is soft benign. Willy EddyPatrick Cynara Tatham, MD 08/22 2232  Patient does have a left shift on CBC. Renal function appears at baseline and patient does not have any lactic acidosis at this time. Chest x-ray read as normal. Does have instructed her crackles in the right base and given his productive cough and fever doses clinically consistent with pneumonia. Will continue to pursue treatment for HCAP as this appears to be most probable source.  I spoke with Dr. Emmit PomfretHugelmeyer regarding the patient's presentation and she agrees to admit patient for further evaluation and management.  Have discussed with the patient and available family all diagnostics and treatments performed thus far and all questions were answered to the best of my ability. The patient demonstrates understanding and agreement with plan.  Willy EddyPatrick Gabriele Loveland, MD 08/22 2327     ____________________________________________   FINAL CLINICAL IMPRESSION(S) / ED DIAGNOSES  Final diagnoses:  HCAP (healthcare-associated pneumonia)  CKD (chronic kidney disease) stage 4, GFR 15-29 ml/min (HCC)      NEW MEDICATIONS STARTED DURING THIS VISIT:  New Prescriptions   No medications on file     Note:  This document was prepared using Dragon voice recognition software and may include unintentional dictation errors.    Willy EddyPatrick Tonda Wiederhold, MD 02/27/16 [phone]

## 2016-02-28 ENCOUNTER — Inpatient Hospital Stay: Payer: Medicare Other

## 2016-02-28 ENCOUNTER — Encounter: Payer: Self-pay | Admitting: Internal Medicine

## 2016-02-28 DIAGNOSIS — N184 Chronic kidney disease, stage 4 (severe): Secondary | ICD-10-CM | POA: Diagnosis present

## 2016-02-28 DIAGNOSIS — N400 Enlarged prostate without lower urinary tract symptoms: Secondary | ICD-10-CM | POA: Diagnosis present

## 2016-02-28 DIAGNOSIS — E872 Acidosis: Secondary | ICD-10-CM | POA: Diagnosis present

## 2016-02-28 DIAGNOSIS — E8770 Fluid overload, unspecified: Secondary | ICD-10-CM | POA: Diagnosis not present

## 2016-02-28 DIAGNOSIS — I739 Peripheral vascular disease, unspecified: Secondary | ICD-10-CM | POA: Diagnosis present

## 2016-02-28 DIAGNOSIS — G9340 Encephalopathy, unspecified: Secondary | ICD-10-CM | POA: Diagnosis present

## 2016-02-28 DIAGNOSIS — E785 Hyperlipidemia, unspecified: Secondary | ICD-10-CM | POA: Diagnosis present

## 2016-02-28 DIAGNOSIS — Z794 Long term (current) use of insulin: Secondary | ICD-10-CM | POA: Diagnosis not present

## 2016-02-28 DIAGNOSIS — E1121 Type 2 diabetes mellitus with diabetic nephropathy: Secondary | ICD-10-CM | POA: Diagnosis present

## 2016-02-28 DIAGNOSIS — Z7982 Long term (current) use of aspirin: Secondary | ICD-10-CM | POA: Diagnosis not present

## 2016-02-28 DIAGNOSIS — Y95 Nosocomial condition: Secondary | ICD-10-CM | POA: Diagnosis present

## 2016-02-28 DIAGNOSIS — D638 Anemia in other chronic diseases classified elsewhere: Secondary | ICD-10-CM | POA: Diagnosis present

## 2016-02-28 DIAGNOSIS — I959 Hypotension, unspecified: Secondary | ICD-10-CM | POA: Diagnosis present

## 2016-02-28 DIAGNOSIS — Z79899 Other long term (current) drug therapy: Secondary | ICD-10-CM | POA: Diagnosis not present

## 2016-02-28 DIAGNOSIS — R6510 Systemic inflammatory response syndrome (SIRS) of non-infectious origin without acute organ dysfunction: Secondary | ICD-10-CM | POA: Diagnosis present

## 2016-02-28 DIAGNOSIS — I129 Hypertensive chronic kidney disease with stage 1 through stage 4 chronic kidney disease, or unspecified chronic kidney disease: Secondary | ICD-10-CM | POA: Diagnosis present

## 2016-02-28 DIAGNOSIS — Z66 Do not resuscitate: Secondary | ICD-10-CM | POA: Diagnosis present

## 2016-02-28 DIAGNOSIS — E11649 Type 2 diabetes mellitus with hypoglycemia without coma: Secondary | ICD-10-CM | POA: Diagnosis present

## 2016-02-28 DIAGNOSIS — J45909 Unspecified asthma, uncomplicated: Secondary | ICD-10-CM | POA: Diagnosis present

## 2016-02-28 DIAGNOSIS — F0390 Unspecified dementia without behavioral disturbance: Secondary | ICD-10-CM | POA: Diagnosis present

## 2016-02-28 DIAGNOSIS — N179 Acute kidney failure, unspecified: Secondary | ICD-10-CM | POA: Diagnosis present

## 2016-02-28 DIAGNOSIS — H9190 Unspecified hearing loss, unspecified ear: Secondary | ICD-10-CM | POA: Diagnosis present

## 2016-02-28 DIAGNOSIS — E1122 Type 2 diabetes mellitus with diabetic chronic kidney disease: Secondary | ICD-10-CM | POA: Diagnosis present

## 2016-02-28 DIAGNOSIS — J209 Acute bronchitis, unspecified: Secondary | ICD-10-CM | POA: Diagnosis present

## 2016-02-28 DIAGNOSIS — K219 Gastro-esophageal reflux disease without esophagitis: Secondary | ICD-10-CM | POA: Diagnosis present

## 2016-02-28 LAB — CBC
HEMATOCRIT: 28.7 % — AB (ref 40.0–52.0)
Hemoglobin: 9.8 g/dL — ABNORMAL LOW (ref 13.0–18.0)
MCH: 30.5 pg (ref 26.0–34.0)
MCHC: 34.3 g/dL (ref 32.0–36.0)
MCV: 88.9 fL (ref 80.0–100.0)
PLATELETS: 185 10*3/uL (ref 150–440)
RBC: 3.23 MIL/uL — AB (ref 4.40–5.90)
RDW: 14 % (ref 11.5–14.5)
WBC: 10.2 10*3/uL (ref 3.8–10.6)

## 2016-02-28 LAB — BASIC METABOLIC PANEL
ANION GAP: 7 (ref 5–15)
BUN: 47 mg/dL — ABNORMAL HIGH (ref 6–20)
CHLORIDE: 112 mmol/L — AB (ref 101–111)
CO2: 22 mmol/L (ref 22–32)
Calcium: 7.8 mg/dL — ABNORMAL LOW (ref 8.9–10.3)
Creatinine, Ser: 3.58 mg/dL — ABNORMAL HIGH (ref 0.61–1.24)
GFR calc Af Amer: 16 mL/min — ABNORMAL LOW (ref >60)
GFR, EST NON AFRICAN AMERICAN: 14 mL/min — AB (ref >60)
GLUCOSE: 69 mg/dL (ref 65–99)
Potassium: 4.1 mmol/L (ref 3.5–5.1)
Sodium: 141 mmol/L (ref 135–145)

## 2016-02-28 LAB — GLUCOSE, CAPILLARY
GLUCOSE-CAPILLARY: 61 mg/dL — AB (ref 65–99)
GLUCOSE-CAPILLARY: 79 mg/dL (ref 65–99)
Glucose-Capillary: 88 mg/dL (ref 65–99)
Glucose-Capillary: 79 mg/dL (ref 65–99)
Glucose-Capillary: 76 mg/dL (ref 65–99)
Glucose-Capillary: 56 mg/dL — ABNORMAL LOW (ref 65–99)
Glucose-Capillary: 125 mg/dL — ABNORMAL HIGH (ref 65–99)

## 2016-02-28 LAB — EXPECTORATED SPUTUM ASSESSMENT W REFEX TO RESP CULTURE

## 2016-02-28 LAB — LACTIC ACID, PLASMA: Lactic Acid, Venous: 0.8 mmol/L (ref 0.5–1.9)

## 2016-02-28 LAB — EXPECTORATED SPUTUM ASSESSMENT W GRAM STAIN, RFLX TO RESP C

## 2016-02-28 LAB — MRSA PCR SCREENING: MRSA by PCR: NEGATIVE

## 2016-02-28 MED ORDER — OXYBUTYNIN CHLORIDE ER 5 MG PO TB24: 5.0000 mg | ORAL_TABLET | Freq: Every day | ORAL | Status: DC

## 2016-02-28 MED ORDER — DOCUSATE SODIUM 100 MG PO CAPS
100.0000 mg | ORAL_CAPSULE | Freq: Two times a day (BID) | ORAL | Status: DC
  Administered 2016-02-28 – 2016-03-04 (×11): 100 mg via ORAL
  Filled 2016-02-28 (×12): qty 1

## 2016-02-28 MED ORDER — METOPROLOL SUCCINATE ER 25 MG PO TB24
25.0000 mg | ORAL_TABLET | Freq: Every day | ORAL | Status: DC
  Administered 2016-02-28 – 2016-03-04 (×6): 25 mg via ORAL
  Filled 2016-02-28 (×6): qty 1

## 2016-02-28 MED ORDER — PANTOPRAZOLE SODIUM 40 MG PO TBEC
40.0000 mg | DELAYED_RELEASE_TABLET | Freq: Every day | ORAL | Status: DC
  Administered 2016-02-28 – 2016-03-04 (×6): 40 mg via ORAL
  Filled 2016-02-28 (×6): qty 1

## 2016-02-28 MED ORDER — MELATONIN 3 MG PO TABS: 3.0000 mg | ORAL_TABLET | Freq: Every day | ORAL | Status: DC

## 2016-02-28 MED ORDER — POLYETHYLENE GLYCOL 3350 17 G PO PACK
17.0000 g | PACK | Freq: Every day | ORAL | Status: DC
Start: 2016-02-28 — End: 2016-03-04
  Administered 2016-02-28 – 2016-03-04 (×3): 17 g via ORAL
  Filled 2016-02-28 (×4): qty 1

## 2016-02-28 MED ORDER — LEVOFLOXACIN IN D5W 250 MG/50ML IV SOLN
250.0000 mg | INTRAVENOUS | Status: DC
Start: 2016-03-01 — End: 2016-03-01
  Administered 2016-03-01: 250 mg via INTRAVENOUS
  Filled 2016-02-28: qty 50

## 2016-02-28 MED ORDER — TAMSULOSIN HCL 0.4 MG PO CAPS
0.4000 mg | ORAL_CAPSULE | Freq: Every day | ORAL | Status: DC
  Administered 2016-02-28 – 2016-03-04 (×6): 0.4 mg via ORAL
  Filled 2016-02-28 (×6): qty 1

## 2016-02-28 MED ORDER — ATORVASTATIN CALCIUM 20 MG PO TABS
10.0000 mg | ORAL_TABLET | Freq: Every day | ORAL | Status: DC
  Administered 2016-02-28 – 2016-02-29 (×2): 10 mg via ORAL
  Filled 2016-02-28 (×2): qty 1

## 2016-02-28 MED ORDER — MECLIZINE HCL 25 MG PO TABS: 25.0000 mg | ORAL_TABLET | Freq: Three times a day (TID) | ORAL | Status: DC | PRN

## 2016-02-28 MED ORDER — MELATONIN 5 MG PO TABS
2.5000 mg | ORAL_TABLET | Freq: Every day | ORAL | Status: DC
  Administered 2016-02-28 – 2016-03-03 (×4): 2.5 mg via ORAL
  Filled 2016-02-28 (×6): qty 0.5

## 2016-02-28 MED ORDER — GLIPIZIDE ER 5 MG PO TB24
5.0000 mg | ORAL_TABLET | Freq: Every day | ORAL | Status: DC
  Administered 2016-02-28: 5 mg via ORAL
  Filled 2016-02-28: qty 1

## 2016-02-28 MED ORDER — GABAPENTIN 100 MG PO CAPS
200.0000 mg | ORAL_CAPSULE | Freq: Two times a day (BID) | ORAL | Status: DC
  Administered 2016-02-28 (×2): 200 mg via ORAL
  Filled 2016-02-28 (×2): qty 2

## 2016-02-28 MED ORDER — ASPIRIN EC 81 MG PO TBEC
81.0000 mg | DELAYED_RELEASE_TABLET | Freq: Every day | ORAL | Status: DC
  Administered 2016-02-28 – 2016-03-04 (×6): 81 mg via ORAL
  Filled 2016-02-28 (×6): qty 1

## 2016-02-28 MED ORDER — INSULIN GLARGINE 100 UNIT/ML ~~LOC~~ SOLN
7.0000 [IU] | Freq: Every day | SUBCUTANEOUS | Status: DC
  Administered 2016-02-28: 7 [IU] via SUBCUTANEOUS
  Filled 2016-02-28: qty 0.07

## 2016-02-28 MED ORDER — SODIUM CHLORIDE 0.9 % IV SOLN
INTRAVENOUS | Status: DC
  Administered 2016-02-28 (×2): via INTRAVENOUS

## 2016-02-28 MED ORDER — ONDANSETRON HCL 4 MG PO TABS: 4.0000 mg | ORAL_TABLET | Freq: Four times a day (QID) | ORAL | Status: DC | PRN

## 2016-02-28 MED ORDER — INSULIN ASPART 100 UNIT/ML ~~LOC~~ SOLN: 0.0000 [IU] | Freq: Every day | SUBCUTANEOUS | Status: DC

## 2016-02-28 MED ORDER — TRAMADOL HCL 50 MG PO TABS: 50.0000 mg | ORAL_TABLET | Freq: Four times a day (QID) | ORAL | Status: DC | PRN

## 2016-02-28 MED ORDER — DEXTROSE 5 % IV SOLN
2.0000 g | INTRAVENOUS | Status: DC
  Filled 2016-02-28: qty 2

## 2016-02-28 MED ORDER — INSULIN ASPART 100 UNIT/ML ~~LOC~~ SOLN: 0.0000 [IU] | Freq: Three times a day (TID) | SUBCUTANEOUS | Status: DC

## 2016-02-28 MED ORDER — ONDANSETRON HCL 4 MG/2ML IJ SOLN: 4.0000 mg | Freq: Four times a day (QID) | INTRAMUSCULAR | Status: DC | PRN

## 2016-02-28 MED ORDER — AMLODIPINE BESYLATE 10 MG PO TABS
10.0000 mg | ORAL_TABLET | Freq: Every day | ORAL | Status: DC
  Administered 2016-02-28 – 2016-03-04 (×6): 10 mg via ORAL
  Filled 2016-02-28 (×6): qty 1

## 2016-02-28 MED ORDER — VANCOMYCIN HCL IN DEXTROSE 750-5 MG/150ML-% IV SOLN
750.0000 mg | INTRAVENOUS | Status: DC
  Filled 2016-02-28: qty 150

## 2016-02-28 MED ORDER — VITAMIN D 1000 UNITS PO TABS
1000.0000 [IU] | ORAL_TABLET | Freq: Every day | ORAL | Status: DC
  Administered 2016-02-28 – 2016-03-01 (×3): 1000 [IU] via ORAL
  Filled 2016-02-28 (×3): qty 1

## 2016-02-28 MED ORDER — TRAMADOL HCL 50 MG PO TABS: 50.0000 mg | ORAL_TABLET | Freq: Two times a day (BID) | ORAL | Status: DC | PRN

## 2016-02-28 MED ORDER — ACETAMINOPHEN 650 MG RE SUPP: 650.0000 mg | Freq: Four times a day (QID) | RECTAL | Status: DC | PRN

## 2016-02-28 MED ORDER — HEPARIN SODIUM (PORCINE) 5000 UNIT/ML IJ SOLN
5000.0000 [IU] | Freq: Three times a day (TID) | INTRAMUSCULAR | Status: DC
  Administered 2016-02-28 – 2016-03-03 (×12): 5000 [IU] via SUBCUTANEOUS
  Filled 2016-02-28 (×12): qty 1

## 2016-02-28 MED ORDER — VANCOMYCIN HCL IN DEXTROSE 1-5 GM/200ML-% IV SOLN
1000.0000 mg | INTRAVENOUS | Status: DC
  Filled 2016-02-28: qty 200

## 2016-02-28 MED ORDER — GABAPENTIN 100 MG PO CAPS
100.0000 mg | ORAL_CAPSULE | Freq: Two times a day (BID) | ORAL | Status: DC
  Administered 2016-02-28 – 2016-03-01 (×4): 100 mg via ORAL
  Filled 2016-02-28 (×4): qty 1

## 2016-02-28 MED ORDER — LEVOFLOXACIN IN D5W 500 MG/100ML IV SOLN
500.0000 mg | Freq: Once | INTRAVENOUS | Status: AC
  Administered 2016-02-28: 14:00:00 500 mg via INTRAVENOUS
  Filled 2016-02-28: qty 100

## 2016-02-28 MED ORDER — ACETAMINOPHEN 325 MG PO TABS
650.0000 mg | ORAL_TABLET | Freq: Four times a day (QID) | ORAL | Status: DC | PRN
Start: 2016-02-28 — End: 2016-03-04
  Administered 2016-02-29: 18:00:00 650 mg via ORAL
  Filled 2016-02-28: qty 2

## 2016-02-28 MED ORDER — SODIUM BICARBONATE 650 MG PO TABS
650.0000 mg | ORAL_TABLET | Freq: Two times a day (BID) | ORAL | Status: DC
  Administered 2016-02-28 – 2016-03-04 (×11): 650 mg via ORAL
  Filled 2016-02-28 (×12): qty 1

## 2016-02-28 MED ORDER — SODIUM CHLORIDE 0.9 % IV SOLN
INTRAVENOUS | Status: DC
  Administered 2016-02-28: 03:00:00 via INTRAVENOUS

## 2016-02-28 MED ORDER — DEXTROSE 50 % IV SOLN
1.0000 | Freq: Once | INTRAVENOUS | Status: AC
  Administered 2016-02-28: 14:00:00 50 mL via INTRAVENOUS
  Filled 2016-02-28: qty 50

## 2016-02-28 NOTE — NC FL2 (Signed)
[place] MEDICAID FL2 LEVEL OF CARE SCREENING TOOL     IDENTIFICATION  Patient Name: Rodney Valencia Birthdate: 1923/11/23 Sex: male Admission Date (Current Location): 02/27/2016  [place]   Facility and Address:  [facility], [address], [place], [place] [address]      Provider Number: [id number]  Attending Physician Name and Address:  Alford Highlandichard Wieting, MD  Relative Name and Phone Number:       Current Level of Care: Domiciliary (Rest home) Recommended Level of Care: Assisted Living Facility Prior Approval Number:    Date Approved/Denied:   PASRR Number:  ( [id number] )  Discharge Plan: Domiciliary (Rest home)    Current Diagnoses: Patient Active Problem List   Diagnosis Date Noted  . SIRS (systemic inflammatory response syndrome) (HCC) 02/28/2016   CKD (chronic kidney disease) stage 4, GFR 15-29 ml/min (HCC)    . Diabetes mellitus without complication (HCC)   . Hypertension   . PVD (peripheral vascular disease) (HCC)   . Renal disorder      Orientation RESPIRATION BLADDER Height & Weight     Self  Normal Continent Weight: 156 lb 11.2 oz (71.1 kg) Height:  5\' 5"  (165.1 cm)  BEHAVIORAL SYMPTOMS/MOOD NEUROLOGICAL BOWEL NUTRITION STATUS   (none )  (none) Incontinent Diet (Diet: Heart Healthy/ Carb Modified. )  AMBULATORY STATUS COMMUNICATION OF NEEDS Skin   Supervision Verbally Normal                       Personal Care Assistance Level of Assistance  Bathing, Feeding, Dressing Bathing Assistance: Limited assistance Feeding assistance: Limited assistance Dressing Assistance: Limited assistance     Functional Limitations Info  Sight, Hearing, Speech Sight Info: Impaired Hearing Info: Impaired Speech Info: Adequate    SPECIAL CARE FACTORS FREQUENCY                       Contractures      Additional Factors Info  Code Status, Allergies Code Status Info:  (Full Code.  ) Allergies Info:  (No Known Allergies. )             Discharge Medications:    Medication List    STOP taking these medications   glipiZIDE 5 MG 24 hr tablet Commonly known as:  GLUCOTROL XL    TAKE these medications   acetaminophen 500 MG tablet Commonly known as:  TYLENOL Take 500 mg by mouth 3 (three) times daily.  amLODipine 10 MG tablet Commonly known as:  NORVASC Take 10 mg by mouth daily.  aspirin EC 81 MG tablet Take 81 mg by mouth daily.  atorvastatin 10 MG tablet Commonly known as:  LIPITOR Take 10 mg by mouth at bedtime.  cholecalciferol 1000 units tablet Commonly known as:  VITAMIN D Take 1,000 Units by mouth daily.  docusate sodium 100 MG capsule Commonly known as:  COLACE Take 100 mg by mouth 2 (two) times daily.  gabapentin 100 MG capsule Commonly known as:  NEURONTIN Take 200 mg by mouth 2 (two) times daily.  insulin glargine 100 UNIT/ML injection Commonly known as:  LANTUS Inject 7 Units into the skin daily.  levofloxacin 250 MG tablet Commonly known as:  LEVAQUIN Take 1 tablet (250 mg total) by mouth every other day.  meclizine 25 MG tablet Commonly known as:  ANTIVERT Take 25 mg by mouth every 8 (eight) hours as needed for dizziness.  Melatonin 3 MG Tabs Take  3 mg by mouth at bedtime.  metoprolol succinate 25 MG 24 hr tablet Commonly known as:  TOPROL-XL Take 25 mg by mouth daily.  omeprazole 20 MG capsule Commonly known as:  PRILOSEC Take 20 mg by mouth 2 (two) times daily before a meal.  ondansetron 4 MG disintegrating tablet Commonly known as:  ZOFRAN ODT Take 1 tablet (4 mg total) by mouth every 8 (eight) hours as needed for nausea or vomiting.  polyethylene glycol packet Commonly known as:  MIRALAX / GLYCOLAX Take 17 g by mouth daily.  sodium bicarbonate 650 MG tablet Take 650 mg by mouth 2 (two) times daily.  tamsulosin 0.4 MG Caps capsule Commonly known as:  FLOMAX Take 0.4 mg by mouth daily.  tolterodine 2 MG  tablet Commonly known as:  DETROL Take 2 mg by mouth 2 (two) times daily.  traMADol 50 MG tablet Commonly known as:  ULTRAM Take 50 mg by mouth every 6 (six) hours as needed.        Relevant Imaging Results:  Relevant Lab Results:   Additional Information  (SSN: [id number])  Sample, Darleen CrockerBailey M, LCSW

## 2016-02-28 NOTE — Progress Notes (Signed)
[facility] PROGRESS NOTE  Shafter HarbisoFuller Canadan ZOX:[id number] DOB: July 02, 1924 DOA: 02/27/2016 PCP: No PCP Per Patient  HPI/Subjective: Patient awakened from sleep studies okay and then fell back asleep.  Objective: Vitals:   02/28/16 0314 02/28/16 0507  BP: (!) 162/83 (!) 138/52  Pulse: (!) 56 63  Resp: (!) 24 20  Temp: 97.7 F (36.5 C) 98.7 F (37.1 C)    Filed Weights   02/27/16 2143 02/27/16 2149 02/28/16 0314  Weight: 159 lb (72.1 kg) 163 lb 9.6 oz (74.2 kg) 156 lb 11.2 oz (71.1 kg)    ROS: Review of Systems  Unable to perform ROS: Mental status change   Exam: Physical Exam  Constitutional: He is oriented to person, place, and time.  HENT:  Nose: No mucosal edema.  Mouth/Throat: No oropharyngeal exudate or posterior oropharyngeal edema.  Eyes: Conjunctivae, EOM and lids are normal. Pupils are equal, round, and reactive to light.  Neck: No JVD present. Carotid bruit is not present. No edema present. No thyroid mass and no thyromegaly present.  Cardiovascular: S1 normal and S2 normal.  Exam reveals no gallop.   No murmur heard. Pulses:      Dorsalis pedis pulses are 2+ on the right side, and 2+ on the left side.  Respiratory: No respiratory distress. He has decreased breath sounds in the right lower field and the left lower field. He has no wheezes. He has no rhonchi. He has no rales.  GI: Soft. Bowel sounds are normal. There is no tenderness.  Musculoskeletal:       Right ankle: He exhibits swelling.       Left ankle: He exhibits swelling.  Lymphadenopathy:    He has no cervical adenopathy.  Neurological: He is alert and oriented to person, place, and time. No cranial nerve deficit.  Skin: Skin is warm. No rash noted. Nails show no clubbing.  Psychiatric: He has a normal mood and affect.      Data Reviewed: Basic Metabolic Panel:  Recent Labs Lab 02/27/16 2154 02/28/16 0411  NA 138 141  K 4.8 4.1  CL 110 112*  CO2 23 22  GLUCOSE 153* 69  BUN 52* 47*   CREATININE 3.61* 3.58*  CALCIUM 7.9* 7.8*   Liver Function Tests:  Recent Labs Lab 02/27/16 2154  AST 20  ALT 15*  ALKPHOS 86  BILITOT 0.3  PROT 5.9*  ALBUMIN 3.5   CBC:  Recent Labs Lab 02/27/16 2154 02/28/16 0411  WBC 9.9 10.2  NEUTROABS 7.3*  --   HGB 9.8* 9.8*  HCT 28.7* 28.7*  MCV 88.9 88.9  PLT 199 185    CBG:  Recent Labs Lab 02/28/16 0617 02/28/16 0801 02/28/16 1200  GLUCAP 88 79 76    Recent Results (from the past 240 hour(s))  Blood Culture (routine x 2)     Status: None (Preliminary result)   Collection Time: 02/27/16  9:54 PM  Result Value Ref Range Status   Specimen Description BLOOD RIGHT ASSIST CONTROL  Final   Special Requests BOTTLES DRAWN AEROBIC AND ANAEROBIC 8CC  Final   Culture NO GROWTH < 12 HOURS  Final   Report Status PENDING  Incomplete  Blood Culture (routine x 2)     Status: None (Preliminary result)   Collection Time: 02/27/16  9:54 PM  Result Value Ref Range Status   Specimen Description BLOOD LEFT FATTY CASTS  Final   Special Requests BOTTLES DRAWN AEROBIC AND ANAEROBIC 5CC  Final   Culture NO GROWTH < 12  HOURS  Final   Report Status PENDING  Incomplete  Culture, expectorated sputum-assessment     Status: None   Collection Time: 02/27/16 11:29 PM  Result Value Ref Range Status   Specimen Description SPUTUM  Final   Special Requests NONE  Final   Sputum evaluation   Final    Sputum specimen not acceptable for testing.  Please recollect.   SHEY BREWER @1040  ON 02/28/16 BY [facility]KP    Report Status 02/28/2016 FINAL  Final  MRSA PCR Screening     Status: None   Collection Time: 02/28/16  3:35 AM  Result Value Ref Range Status   MRSA by PCR NEGATIVE NEGATIVE Final    Comment:        The GeneXpert MRSA Assay (FDA approved for NASAL specimens only), is one component of a comprehensive MRSA colonization surveillance program. It is not intended to diagnose MRSA infection nor to guide or monitor treatment for MRSA  infections.      Studies: Dg Chest 2 View  Result Date: 02/27/2016 CLINICAL DATA:  Fever, productive cough EXAM: CHEST  2 VIEW COMPARISON:  None. FINDINGS: Lungs are essentially clear. No focal consolidation. No pleural effusion or pneumothorax. The heart is normal in size. Degenerative changes of the visualized thoracolumbar spine. IMPRESSION: No evidence of acute cardiopulmonary disease. Electronically Signed   By: Charline BillsSriyesh  Krishnan M.D.   On: 02/27/2016 22:14    Scheduled Meds: . amLODipine  10 mg Oral Daily  . aspirin EC  81 mg Oral Daily  . atorvastatin  10 mg Oral QHS  . cholecalciferol  1,000 Units Oral Daily  . docusate sodium  100 mg Oral BID  . gabapentin  100 mg Oral BID  . heparin  5,000 Units Subcutaneous Q8H  . insulin glargine  7 Units Subcutaneous Daily  . levofloxacin (LEVAQUIN) IV  500 mg Intravenous Once   Followed by  . [START ON 03/01/2016] levofloxacin (LEVAQUIN) IV  250 mg Intravenous Q48H  . Melatonin  2.5 mg Oral QHS  . metoprolol succinate  25 mg Oral Daily  . pantoprazole  40 mg Oral Daily  . polyethylene glycol  17 g Oral Daily  . sodium bicarbonate  650 mg Oral BID  . tamsulosin  0.4 mg Oral Daily   Continuous Infusions: . sodium chloride 30 mL/hr at 02/28/16 1119    Assessment/Plan:  1. Systemic inflammatory response syndrome. Unclear what I'm treating at this point. Switched antibiotics to Levaquin. So far blood cultures are negative. Urinalysis unremarkable. 2. Acute encephalopathy. Unclear etiology so far could be secondary to systemic inflammatory response syndrome. 3. Chronic kidney disease stage IV. Nephrology consultation. Gentle IV fluid hydration. 4. Type 2 diabetes. Sugar on the lower side. Hold glipizide. Given amp of D50. Hold Lantus. 5. Weakness PT evaluation 6. Hyperlipidemia unspecified on atorvastatin 7. GERD on Protonix 8. BPH on Flomax  Code Status:     Code Status Orders        Start     Ordered   02/28/16 0307  Full  code  Continuous     02/28/16 0306    Code Status History    Date Active Date Inactive Code Status Order ID Comments User Context   This patient has a current code status but no historical code status.     Disposition Plan: TBD  Time spent: 35minutes  Alford HighlandWIETING, Naomi Fitton  [employer] [facility]

## 2016-02-28 NOTE — Progress Notes (Signed)
Pharmacy Antibiotic Note  Rodney Valencia is a 82 y.o. male admitted on 02/27/2016 with SIRS.  Pharmacy has been consulted for vancomycin and cefepime dosing.  This is day #2 of antibiotics.  Plan: Will decrease vancomycin dose to 750 mg IV q48h based on renal function Goal vancomycin trough 15-20 mcg/mL  Kinetics: kei 0.015 hr-1  t1/2 46 hours DW 71kg  Vd 49L Anticipated trough = 15 mcg/mL  Continue cefepime 2 gram q 24 hours  Height: 5\' 5"  (165.1 cm) Weight: 156 lb 11.2 oz (71.1 kg) IBW/kg (Calculated) : 61.5  Temp (24hrs), Avg:98.7 F (37.1 C), Min:97.7 F (36.5 C), Max:100.9 F (38.3 C)   Recent Labs Lab 02/27/16 2154 02/28/16 0411  WBC 9.9 10.2  CREATININE 3.61* 3.58*  LATICACIDVEN 1.1 0.8    Estimated Creatinine Clearance: 11.9 mL/min (by C-G formula based on SCr of 3.58 mg/dL).    No Known Allergies  Antimicrobials this admission: vancomycin  >>  cefepime  >>   Dose adjustments this admission: 8/23 Changed vancomycin 1000 mg IV q48h to 750 mg IV q48h  Microbiology results: 8/22 BCx: No growth < 12 hours 8/22 Sputum: pending  8/23 MRSA PCR: negative  Thank you for allowing pharmacy to be a part of this patient's care.  Cindi CarbonMary M Lenea Bywater, PharmD 02/28/2016 8:23 AM

## 2016-02-28 NOTE — Progress Notes (Signed)
Pharmacy Antibiotic Note  Rodney Valencia is a 82 y.o. male admitted on 02/27/2016 with SIRS.  Pharmacy has been consulted for vancomycin and cefepime dosing.  Plan: kei 0.016 hr-1  t1/2 43 hours DW 74kg  Vd 52L Vancomycin 1 gram q 48 hours ordered with stacked dosing. Level before 4th dose. Goal trough 15-20.  Cefepime 2 gram q 24 hours ordered.  Height: 5\' 5"  (165.1 cm) Weight: 156 lb 11.2 oz (71.1 kg) IBW/kg (Calculated) : 61.5  Temp (24hrs), Avg:98.8 F (37.1 C), Min:97.7 F (36.5 C), Max:100.9 F (38.3 C)   Recent Labs Lab 02/27/16 2154  WBC 9.9  CREATININE 3.61*  LATICACIDVEN 1.1    Estimated Creatinine Clearance: 11.8 mL/min (by C-G formula based on SCr of 3.61 mg/dL).    No Known Allergies  Antimicrobials this admission: vancomycin  >>  cefepime  >>   Dose adjustments this admission:   Microbiology results: 8/22 BCx: cefepime 8/22 Sputum: pending  8/23 MRSA PCR: pending  Thank you for allowing pharmacy to be a part of this patient's care.  Laith Antonelli S 02/28/2016 3:51 AM

## 2016-02-28 NOTE — H&P (Signed)
[facility] [place] at [facility]   PATIENT NAME: Rodney Valencia    MR#:  [id number]  DATE OF BIRTH:  [birth year]  DATE OF ADMISSION:  02/27/2016  PRIMARY CARE PHYSICIAN: No PCP Per Patient   REQUESTING/REFERRING PHYSICIAN:   CHIEF COMPLAINT:   Chief Complaint  Patient presents with  . Fever    HISTORY OF PRESENT ILLNESS: Rodney Valencia  is a 82 y.o. male with a known history of Chronic kidney disease stage IV, diabetes mellitus type 2, hypertension, peripheral vascular disease presented to the emergency room with fever. Patient was referred from assisted-living facility had fever of 10 8F was given Tylenol. Patient is not a great historian. He is awake and alert but not completely oriented to time place and person. Has cough which is dry in nature. No history of fall or head injury according to transfer note. No complaints of any chest pain. Had an episode of shortness of breath but no complaints of any orthopnea.No complaints of any abdominal pain. Patient was worked up in the emergency room and started on IV antibiotics. Hospitalist service was consulted for further care of the patient.Family was not available at bed side for information.  PAST MEDICAL HISTORY:   Past Medical History:  Diagnosis Date  . CKD (chronic kidney disease) stage 4, GFR 15-29 ml/min (HCC)   . Diabetes mellitus without complication (HCC)   . Hypertension   . PVD (peripheral vascular disease) (HCC)   . Renal disorder     PAST SURGICAL HISTORY: Past Surgical History:  Procedure Laterality Date  . none      SOCIAL HISTORY:  Social History  Substance Use Topics  . Smoking status: Never Smoker  . Smokeless tobacco: Current User  . Alcohol use No    FAMILY HISTORY:  Family History  Problem Relation Age of Onset  . Diabetes Mellitus II Mother     DRUG ALLERGIES: No Known Allergies  REVIEW OF SYSTEMS:  Could not be completely obtained as patient is  confused.  MEDICATIONS AT HOME:  Prior to Admission medications   Medication Sig Start Date End Date Taking? Authorizing Provider  acetaminophen (TYLENOL) 500 MG tablet Take 500 mg by mouth 3 (three) times daily.   Yes Historical Provider, MD  amLODipine (NORVASC) 10 MG tablet Take 10 mg by mouth daily.   Yes Historical Provider, MD  aspirin EC 81 MG tablet Take 81 mg by mouth daily.   Yes Historical Provider, MD  atorvastatin (LIPITOR) 10 MG tablet Take 10 mg by mouth at bedtime.   Yes Historical Provider, MD  cholecalciferol (VITAMIN D) 1000 units tablet Take 1,000 Units by mouth daily.   Yes Historical Provider, MD  docusate sodium (COLACE) 100 MG capsule Take 100 mg by mouth 2 (two) times daily.   Yes Historical Provider, MD  gabapentin (NEURONTIN) 100 MG capsule Take 200 mg by mouth 2 (two) times daily.   Yes Historical Provider, MD  glipiZIDE (GLUCOTROL XL) 5 MG 24 hr tablet Take 5 mg by mouth daily with breakfast.    Yes Historical Provider, MD  insulin glargine (LANTUS) 100 UNIT/ML injection Inject 7 Units into the skin daily.    Yes Historical Provider, MD  meclizine (ANTIVERT) 25 MG tablet Take 25 mg by mouth every 8 (eight) hours as needed for dizziness.   Yes Historical Provider, MD  Melatonin 3 MG TABS Take 3 mg by mouth at bedtime.   Yes Historical Provider, MD  metoprolol succinate (TOPROL-XL) 25 MG  24 hr tablet Take 25 mg by mouth daily.   Yes Historical Provider, MD  omeprazole (PRILOSEC) 20 MG capsule Take 20 mg by mouth 2 (two) times daily before a meal.   Yes Historical Provider, MD  ondansetron (ZOFRAN ODT) 4 MG disintegrating tablet Take 1 tablet (4 mg total) by mouth every 8 (eight) hours as needed for nausea or vomiting. 11/23/15  Yes Gayla DossEryka A Gayle, MD  polyethylene glycol (MIRALAX / GLYCOLAX) packet Take 17 g by mouth daily.   Yes Historical Provider, MD  sodium bicarbonate 650 MG tablet Take 650 mg by mouth 2 (two) times daily.   Yes Historical Provider, MD  tamsulosin  (FLOMAX) 0.4 MG CAPS capsule Take 0.4 mg by mouth daily.   Yes Historical Provider, MD  tolterodine (DETROL) 2 MG tablet Take 2 mg by mouth 2 (two) times daily.   Yes Historical Provider, MD  traMADol (ULTRAM) 50 MG tablet Take 50 mg by mouth every 6 (six) hours as needed.   Yes Historical Provider, MD      PHYSICAL EXAMINATION:   VITAL SIGNS: Blood pressure (!) 162/75, pulse 64, temperature (!) 100.9 F (38.3 C), temperature source Rectal, resp. rate 18, height 5\' 10"  (1.778 m), weight 74.2 kg (163 lb 9.6 oz), SpO2 93 %.  GENERAL:  82 y.o.-year-old patient lying in the bed with no acute distress.  EYES: Pupils equal, round, reactive to light and accommodation. No scleral icterus. Extraocular muscles intact.  HEENT: Head atraumatic, normocephalic. Oropharynx dry and nasopharynx clear.  NECK:  Supple, no jugular venous distention. No thyroid enlargement, no tenderness.  LUNGS: Normal breath sounds bilaterally, no wheezing,scattered rales in right lung.  No use of accessory muscles of respiration.  CARDIOVASCULAR: S1, S2 normal. No murmurs, rubs, or gallops.  ABDOMEN: Soft, nontender, nondistended. Bowel sounds present. No organomegaly or mass.  EXTREMITIES: No pedal edema, cyanosis, or clubbing.  NEUROLOGIC: Cranial nerves II through XII are intact. Muscle strength 5/5 in all extremities. Sensation intact. Gait not checked.  PSYCHIATRIC: could not be assessed SKIN: No obvious rash, lesion, or ulcer.   LABORATORY PANEL:   CBC  Recent Labs Lab 02/27/16 2154  WBC 9.9  HGB 9.8*  HCT 28.7*  PLT 199  MCV 88.9  MCH 30.4  MCHC 34.2  RDW 14.2  LYMPHSABS 1.2  MONOABS 1.0  EOSABS 0.3  BASOSABS 0.1   ------------------------------------------------------------------------------------------------------------------  Chemistries   Recent Labs Lab 02/27/16 2154  NA 138  K 4.8  CL 110  CO2 23  GLUCOSE 153*  BUN 52*  CREATININE 3.61*  CALCIUM 7.9*  AST 20  ALT 15*  ALKPHOS  86  BILITOT 0.3   ------------------------------------------------------------------------------------------------------------------ estimated creatinine clearance is 14 mL/min (by C-G formula based on SCr of 3.61 mg/dL). ------------------------------------------------------------------------------------------------------------------ No results for input(s): TSH, T4TOTAL, T3FREE, THYROIDAB in the last 72 hours.  Invalid input(s): FREET3   Coagulation profile No results for input(s): INR, PROTIME in the last 168 hours. ------------------------------------------------------------------------------------------------------------------- No results for input(s): DDIMER in the last 72 hours. -------------------------------------------------------------------------------------------------------------------  Cardiac Enzymes No results for input(s): CKMB, TROPONINI, MYOGLOBIN in the last 168 hours.  Invalid input(s): CK ------------------------------------------------------------------------------------------------------------------ Invalid input(s): POCBNP  ---------------------------------------------------------------------------------------------------------------  Urinalysis    Component Value Date/Time   COLORURINE YELLOW (A) 02/27/2016 2154   APPEARANCEUR CLEAR (A) 02/27/2016 2154   LABSPEC 1.014 02/27/2016 2154   PHURINE 5.0 02/27/2016 2154   GLUCOSEU 50 (A) 02/27/2016 2154   HGBUR NEGATIVE 02/27/2016 2154   BILIRUBINUR NEGATIVE 02/27/2016 2154   KETONESUR NEGATIVE 02/27/2016  2154   PROTEINUR 100 (A) 02/27/2016 2154   NITRITE NEGATIVE 02/27/2016 2154   LEUKOCYTESUR NEGATIVE 02/27/2016 2154     RADIOLOGY: Dg Chest 2 View  Result Date: 02/27/2016 CLINICAL DATA:  Fever, productive cough EXAM: CHEST  2 VIEW COMPARISON:  None. FINDINGS: Lungs are essentially clear. No focal consolidation. No pleural effusion or pneumothorax. The heart is normal in size. Degenerative changes of  the visualized thoracolumbar spine. IMPRESSION: No evidence of acute cardiopulmonary disease. Electronically Signed   By: Charline Bills M.D.   On: 02/27/2016 22:14    EKG: Orders placed or performed during the hospital encounter of 02/27/16  . ED EKG 12-Lead  . ED EKG 12-Lead  . EKG 12-Lead  . EKG 12-Lead    IMPRESSION AND PLAN: 82 year old elderly male patient resident of assisted living facility with history of chronic kidney disease stage IV, hypertension, diabetes mellitus presented to the emergency room with fever and disorientation. Admitting diagnosis 1. Systemic inflammatory response syndrome 2. Fever secondary to SIRS 3. Disorientation 4. Chronic kidney disease stage IV 5. Anemia of chronic disease 6. Hypertension 7. Type 2 diabetes mellitus Treatment plan Admit patient to medical floor inpatient service Start patient on IV vancomycin and IV cefepime antibiotics Follow-up cultures Follow-up renal function Monitor hemoglobin and hematocrit DVT prophylaxis with subcutaneous heparin 5000 units every 8 hourly Supportive care.  All the records are reviewed and case discussed with ED provider. Management plans discussed with the patient, family and they are in agreement.  CODE STATUS:FULL Code Status History    This patient does not have a recorded code status. Please follow your organizational policy for patients in this situation.       TOTAL TIME TAKING CARE OF THIS PATIENT: 50 minutes.    Ihor Austin M.D on 02/28/2016 at 2:32 AM  Between 7am to 6pm - Pager - [phone]  After 6pm go to [email or web address] - password [email or web address] [facility]  [facility]  Office  [phone]  CC: Primary care physician; No PCP Per Patient

## 2016-02-28 NOTE — Clinical Social Work Note (Signed)
Clinical Social Work Assessment  Patient Details  Name: Rodney Valencia MRN: [id number] Date of Birth: March 13, 1924  Date of referral:  02/28/16               Reason for consult:  Discharge Planning                Permission sought to share information with:  Family Supports Permission granted to share information::  Yes, Verbal Permission Granted  Name::        Agency::   [facility])  Relationship::   Pamala Hurry- Friend)  Contact Information:     Housing/Transportation Living arrangements for the past 2 months:  [place] ([facility]) Source of Information:  Patient, Facility [facility]) Patient Interpreter Needed:  None Criminal Activity/Legal Involvement Pertinent to Current Situation/Hospitalization:  No - Comment as needed Significant Relationships:  Friend Lives with:  Facility Resident [facility]) Do you feel safe going back to the place where you live?  Yes Need for family participation in patient care:  No (Coment)  Care giving concerns:  Patient is a resident at [facility].    Social Worker assessment / plan:  CSW met with patient at bedside. Patient is hard at hearing and sated he did not have his hearing aid. CSW introduced herself and her role. Patient reports that he's from [facility]. Informed CSW to contact them to get information. Also informed CSW that she could contact Pamala Hurry ([place] listed on facesheet if needed). Patient is in agreement to return to [facility] at discharge.   CSW contacted Haematologist at [facility]. Left voicemail. Awaiting phone call back. FL2/ PASRR completed and placed in chart. Will send for MD signature on the day of discharge due to [place] having to update discharge medications on the FL2. CSW will continue to follow and assist.   Employment status:  Retired Forensic scientist:  Medicare PT Recommendations:  Not assessed at this time Information / Referral to community  resources:     Patient/Family's Response to care:  Patient is in agreement to return to [facility] at discharge.   Patient/Family's Understanding of and Emotional Response to Diagnosis, Current Treatment, and Prognosis:  Patient reports that he understands his Diagnosis, Current Treatment, and Prognosis. Thanked CSW for her assistance.   Emotional Assessment Appearance:  Appears stated age Attitude/Demeanor/Rapport:   (None) Affect (typically observed):  Calm, Pleasant, Accepting Orientation:  Oriented to Self, Oriented to Place, Oriented to  Time Alcohol / Substance use:  Not Applicable Psych involvement (Current and /or in the community):  No (Comment)  Discharge Needs  Concerns to be addressed:  Discharge Planning Concerns Readmission within the last 30 days:  No Current discharge risk:  Chronically ill Barriers to Discharge:  Continued Medical Work up   [employer], LCSW 02/28/2016, 3:25 PM

## 2016-02-28 NOTE — Progress Notes (Signed)
Pharmacy Antibiotic Note  Rodney Valencia is a 82 y.o. male admitted on 02/27/2016 with pneumonia.  Pharmacy has been consulted for levofloxacin dosing.  Patient was started on vancomycin and cefepime on admission last night. Antibiotics being changed to levofloxacin.  Plan: Levofloxacin 500 mg IV x 1 dose followed by 250 mg IV q48h  Height: 5\' 5"  (165.1 cm) Weight: 156 lb 11.2 oz (71.1 kg) IBW/kg (Calculated) : 61.5  Temp (24hrs), Avg:98.7 F (37.1 C), Min:97.7 F (36.5 C), Max:100.9 F (38.3 C)   Recent Labs Lab 02/27/16 2154 02/28/16 0411  WBC 9.9 10.2  CREATININE 3.61* 3.58*  LATICACIDVEN 1.1 0.8    Estimated Creatinine Clearance: 11.9 mL/min (by C-G formula based on SCr of 3.58 mg/dL).    No Known Allergies   Antimicrobials this admission: Vancomycin and cefepime 8/22 >> 8/23 levofloxacin 8/23 >>   Dose adjustments this admission:  Microbiology results: 8/22 BCx: Sent 8/22 MRSA PCR: negative  Thank you for allowing pharmacy to be a part of this patient's care.  Cindi CarbonMary M Earlie Arciga, PharmD, BCPS 02/28/2016 11:01 AM

## 2016-02-28 NOTE — Care Management (Signed)
Admitted to [facility] with the diagnosis of SIRS. A resident [facility] since April 3rd 2017. POA is listed as Loreli SlotBarbara Blanchard [phone]). Follow[facility] by [facility]. Mr. Tilda FrancoHarbison is sleeping. Gwenette GreetBrenda S Zyan Coby RN MSN CCM Care Management [phone]

## 2016-02-28 NOTE — Care Management Important Message (Signed)
Important Message  Patient Details  Name: Rodney Valencia MRN: [id number] Date of Birth: 10/18/1923   Medicare Important Message Given:  Yes    Gwenette GreetBrenda S Janie Strothman, RN 02/28/2016, 9:09 AM

## 2016-02-28 NOTE — Progress Notes (Signed)
   02/28/16 1540  What Happened  Was fall witnessed? No  Was patient injured? Yes (skin tear left elbow)  Patient found on floor  Found by Staff-comment Lonell Face(Angela Rehm, NT)  Stated prior activity bathroom-unassisted  Follow Up  MD notified [place]  Time MD notified 1550  Family notified Yes-comment Britta Mccreedy(Barbara - emergency contact )  Time family notified 1559  Additional tests No  Simple treatment Dressing  Progress note created (see row info) Yes  Adult Fall Risk Assessment  Risk Factor Category (scoring not indicated) Fall has occurred during this admission (document High fall risk)  Patient's Fall Risk High Fall Risk (>13 points)  Adult Fall Risk Interventions  Required Bundle Interventions *See Row Information* High fall risk - low, moderate, and high requirements implemented  Additional Interventions Individualized elimination schedule;Reorient/diversional activities with confused patients;Room near nurses station;Use of appropriate toileting equipment (bedpan, BSC, etc.)  Screening for Fall Injury Risk  Risk For Fall Injury- See Row Information  F;D;A;Nurse judgement  Injury Prevention Interventions Specialty Low Bed;Safety Sitter/Safety Rounder;Family supervision;Floor Mat  Vitals  Temp 98 F (36.7 C)  Temp Source Oral  BP (!) 178/80  BP Location Right Arm  BP Method Automatic  Patient Position (if appropriate) Lying  Pulse Rate 83  Resp (!) 22  Oxygen Therapy  SpO2 93 %  O2 Device Room Air  Pain Assessment  Pain Assessment No/denies pain  Pain Score 0  PCA/Epidural/Spinal Assessment  Respiratory Pattern Labored  Neurological  Neuro (WDL) X  Level of Consciousness Alert  Orientation Level Oriented to person  Cognition Poor attention/concentration;Poor judgement;Impulsive;Poor safety awareness  Speech Clear  Integumentary  Integumentary (WDL) X  Skin Integrity Skin tear  Skin Tear Location Arm;Elbow  Skin Tear Location Orientation Left  Skin Tear Intervention  Foam

## 2016-02-28 NOTE — Consult Note (Signed)
[facility]  CONSULT NOTE    Date: 02/28/2016                  Patient Name:  Rodney Valencia  MRN: [id number]  DOB: 06/26/1924  Age / Sex: 82 y.o., male         PCP: No PCP Per Patient                 Service Requesting Consult: Dr. Renae GlossWieting                 Reason for Consult: Chronic Kidney Disease stage IV            History of Present Illness: Rodney Valencia is a 82 y.o. white male with hypertension, diabetes mellitus type II, BPH, hyperlipidemia, peripheral vascular disease, who was admitted to [facility] on 02/27/2016 for CKD (chronic kidney disease) stage 4, GFR 15-29 ml/min (HCC) [N18.4] HCAP (healthcare-associated pneumonia) [J18.9]   Patient is hard of hearing and is currently encephalopathic. Unable to get much of a history off of.  He is having subjective chills. Tmax of 100.9. 103 at home. Started on empiric antibiotics.   Patient states [place] with a [place]ist in [place] but cannot give more information than that. Creatinine from May was 3.01 with GFR of 17.    Medications: Outpatient medications: Prescriptions Prior to Admission  Medication Sig Dispense Refill Last Dose  . acetaminophen (TYLENOL) 500 MG tablet Take 500 mg by mouth 3 (three) times daily.   02/27/2016 at Unknown time  . amLODipine (NORVASC) 10 MG tablet Take 10 mg by mouth daily.   02/27/2016 at Unknown time  . aspirin EC 81 MG tablet Take 81 mg by mouth daily.   02/27/2016 at Unknown time  . atorvastatin (LIPITOR) 10 MG tablet Take 10 mg by mouth at bedtime.   02/27/2016 at Unknown time  . cholecalciferol (VITAMIN D) 1000 units tablet Take 1,000 Units by mouth daily.   02/27/2016 at Unknown time  . docusate sodium (COLACE) 100 MG capsule Take 100 mg by mouth 2 (two) times daily.   Past Week at Unknown time  . gabapentin (NEURONTIN) 100 MG capsule Take 200 mg by mouth 2 (two) times daily.   02/27/2016 at Unknown time  . glipiZIDE (GLUCOTROL XL) 5 MG 24 hr tablet Take 5 mg by mouth  daily with breakfast.    02/27/2016 at Unknown time  . insulin glargine (LANTUS) 100 UNIT/ML injection Inject 7 Units into the skin daily.    02/27/2016 at Unknown time  . meclizine (ANTIVERT) 25 MG tablet Take 25 mg by mouth every 8 (eight) hours as needed for dizziness.   prn  . Melatonin 3 MG TABS Take 3 mg by mouth at bedtime.   02/27/2016 at Unknown time  . metoprolol succinate (TOPROL-XL) 25 MG 24 hr tablet Take 25 mg by mouth daily.   02/27/2016 at Unknown time  . omeprazole (PRILOSEC) 20 MG capsule Take 20 mg by mouth 2 (two) times daily before a meal.   02/27/2016 at Unknown time  . ondansetron (ZOFRAN ODT) 4 MG disintegrating tablet Take 1 tablet (4 mg total) by mouth every 8 (eight) hours as needed for nausea or vomiting. 6 tablet 0 prn  . polyethylene glycol (MIRALAX / GLYCOLAX) packet Take 17 g by mouth daily.   02/27/2016 at Unknown time  . sodium bicarbonate 650 MG tablet Take 650 mg by mouth 2 (two) times daily.   02/27/2016 at Unknown time  .  tamsulosin (FLOMAX) 0.4 MG CAPS capsule Take 0.4 mg by mouth daily.   02/27/2016 at Unknown time  . tolterodine (DETROL) 2 MG tablet Take 2 mg by mouth 2 (two) times daily.   02/27/2016 at Unknown time  . traMADol (ULTRAM) 50 MG tablet Take 50 mg by mouth every 6 (six) hours as needed.   prn    Current medications: Current Facility-Administered Medications  Medication Dose Route Frequency Provider Last Rate Last Dose  . 0.9 %  sodium chloride infusion   Intravenous Continuous Alford Highland, MD 30 mL/hr at 02/28/16 1119    . acetaminophen (TYLENOL) tablet 650 mg  650 mg Oral Q6H PRN Ihor Austin, MD       Or  . acetaminophen (TYLENOL) suppository 650 mg  650 mg Rectal Q6H PRN Pavan Pyreddy, MD      . amLODipine (NORVASC) tablet 10 mg  10 mg Oral Daily Ihor Austin, MD   10 mg at 02/28/16 1117  . aspirin EC tablet 81 mg  81 mg Oral Daily Pavan Pyreddy, MD   81 mg at 02/28/16 1117  . atorvastatin (LIPITOR) tablet 10 mg  10 mg Oral QHS Pavan  Pyreddy, MD      . cholecalciferol (VITAMIN D) tablet 1,000 Units  1,000 Units Oral Daily Ihor Austin, MD   1,000 Units at 02/28/16 1117  . docusate sodium (COLACE) capsule 100 mg  100 mg Oral BID Ihor Austin, MD   100 mg at 02/28/16 1117  . gabapentin (NEURONTIN) capsule 100 mg  100 mg Oral BID Alford Highland, MD      . heparin injection 5,000 Units  5,000 Units Subcutaneous Q8H Ihor Austin, MD   5,000 Units at 02/28/16 1410  . insulin aspart (novoLOG) injection 0-5 Units  0-5 Units Subcutaneous QHS Alford Highland, MD      . insulin aspart (novoLOG) injection 0-9 Units  0-9 Units Subcutaneous TID WC Alford Highland, MD      . Melene Muller ON 03/01/2016] Levofloxacin (LEVAQUIN) IVPB 250 mg  250 mg Intravenous Q48H Alford Highland, MD      . meclizine (ANTIVERT) tablet 25 mg  25 mg Oral Q8H PRN Ihor Austin, MD      . Melatonin TABS 2.5 mg  2.5 mg Oral QHS Pavan Pyreddy, MD      . metoprolol succinate (TOPROL-XL) 24 hr tablet 25 mg  25 mg Oral Daily Ihor Austin, MD   25 mg at 02/28/16 1117  . ondansetron (ZOFRAN) tablet 4 mg  4 mg Oral Q6H PRN Ihor Austin, MD       Or  . ondansetron (ZOFRAN) injection 4 mg  4 mg Intravenous Q6H PRN Pavan Pyreddy, MD      . pantoprazole (PROTONIX) EC tablet 40 mg  40 mg Oral Daily Ihor Austin, MD   40 mg at 02/28/16 1117  . polyethylene glycol (MIRALAX / GLYCOLAX) packet 17 g  17 g Oral Daily Ihor Austin, MD   17 g at 02/28/16 1118  . sodium bicarbonate tablet 650 mg  650 mg Oral BID Ihor Austin, MD   650 mg at 02/28/16 1117  . tamsulosin (FLOMAX) capsule 0.4 mg  0.4 mg Oral Daily Ihor Austin, MD   0.4 mg at 02/28/16 1117      Allergies: No Known Allergies    Past Medical History: Past Medical History:  Diagnosis Date  . CKD (chronic kidney disease) stage 4, GFR 15-29 ml/min (HCC)   . Diabetes mellitus without complication (HCC)   . Hypertension   .  PVD (peripheral vascular disease) (HCC)   . Renal disorder      Past Surgical  History: Past Surgical History:  Procedure Laterality Date  . none       Family History: Family History  Problem Relation Age of Onset  . Diabetes Mellitus II Mother      Social History: Social History   Social History  . Marital status: Widowed    Spouse name: N/A  . Number of children: N/A  . Years of education: N/A   Occupational History  . retired    Social History Main Topics  . Smoking status: Never Smoker  . Smokeless tobacco: Current User  . Alcohol use No  . Drug use: No  . Sexual activity: Not on file   Other Topics Concern  . Not on file   Social History Narrative  . No narrative on file     Review of Systems: ROS  Vital Signs: Blood pressure (!) 138/52, pulse 63, temperature 98.7 F (37.1 C), temperature source Oral, resp. rate 20, height 5\' 5"  (1.651 m), weight 71.1 kg (156 lb 11.2 oz), SpO2 95 %.  Weight trends: Filed Weights   02/27/16 2143 02/27/16 2149 02/28/16 0314  Weight: 72.1 kg (159 lb) 74.2 kg (163 lb 9.6 oz) 71.1 kg (156 lb 11.2 oz)    Physical Exam: General: NAD, laying in bed  Head: +hard of hearing  Eyes: Anicteric, PERRL  Neck: Supple, trachea midline  Lungs:  Clear to auscultation  Heart: Regular rate and rhythm  Abdomen:  Soft, nontender,   Extremities: No  peripheral edema.  Neurologic: Nonfocal, moving all four extremities  Skin: No lesions        Lab results: Basic Metabolic Panel:  Recent Labs Lab 02/27/16 2154 02/28/16 0411  NA 138 141  K 4.8 4.1  CL 110 112*  CO2 23 22  GLUCOSE 153* 69  BUN 52* 47*  CREATININE 3.61* 3.58*  CALCIUM 7.9* 7.8*    Liver Function Tests:  Recent Labs Lab 02/27/16 2154  AST 20  ALT 15*  ALKPHOS 86  BILITOT 0.3  PROT 5.9*  ALBUMIN 3.5   No results for input(s): LIPASE, AMYLASE in the last 168 hours. No results for input(s): AMMONIA in the last 168 hours.  CBC:  Recent Labs Lab 02/27/16 2154 02/28/16 0411  WBC 9.9 10.2  NEUTROABS 7.3*  --   HGB 9.8*  9.8*  HCT 28.7* 28.7*  MCV 88.9 88.9  PLT 199 185    Cardiac Enzymes: No results for input(s): CKTOTAL, CKMB, CKMBINDEX, TROPONINI in the last 168 hours.  BNP: Invalid input(s): POCBNP  CBG:  Recent Labs Lab 02/28/16 0617 02/28/16 0801 02/28/16 1200  GLUCAP 88 79 76    Microbiology: Results for orders placed or performed during the hospital encounter of 02/27/16  Blood Culture (routine x 2)     Status: None (Preliminary result)   Collection Time: 02/27/16  9:54 PM  Result Value Ref Range Status   Specimen Description BLOOD RIGHT ASSIST CONTROL  Final   Special Requests BOTTLES DRAWN AEROBIC AND ANAEROBIC 8CC  Final   Culture NO GROWTH < 12 HOURS  Final   Report Status PENDING  Incomplete  Blood Culture (routine x 2)     Status: None (Preliminary result)   Collection Time: 02/27/16  9:54 PM  Result Value Ref Range Status   Specimen Description BLOOD LEFT FATTY CASTS  Final   Special Requests BOTTLES DRAWN AEROBIC AND ANAEROBIC 5CC  Final  Culture NO GROWTH < 12 HOURS  Final   Report Status PENDING  Incomplete  Culture, expectorated sputum-assessment     Status: None   Collection Time: 02/27/16 11:29 PM  Result Value Ref Range Status   Specimen Description SPUTUM  Final   Special Requests NONE  Final   Sputum evaluation   Final    Sputum specimen not acceptable for testing.  Please recollect.   SHEY BREWER @1040  ON 02/28/16 BY [facility]    Report Status 02/28/2016 FINAL  Final  MRSA PCR Screening     Status: None   Collection Time: 02/28/16  3:35 AM  Result Value Ref Range Status   MRSA by PCR NEGATIVE NEGATIVE Final    Comment:        The GeneXpert MRSA Assay (FDA approved for NASAL specimens only), is one component of a comprehensive MRSA colonization surveillance program. It is not intended to diagnose MRSA infection nor to guide or monitor treatment for MRSA infections.     Coagulation Studies: No results for input(s): LABPROT, INR in the last 72  hours.  Urinalysis:  Recent Labs  02/27/16 2154  COLORURINE YELLOW*  LABSPEC 1.014  PHURINE 5.0  GLUCOSEU 50*  HGBUR NEGATIVE  BILIRUBINUR NEGATIVE  KETONESUR NEGATIVE  PROTEINUR 100*  NITRITE NEGATIVE  LEUKOCYTESUR NEGATIVE      Imaging: Dg Chest 2 View  Result Date: 02/27/2016 CLINICAL DATA:  Fever, productive cough EXAM: CHEST  2 VIEW COMPARISON:  None. FINDINGS: Lungs are essentially clear. No focal consolidation. No pleural effusion or pneumothorax. The heart is normal in size. Degenerative changes of the visualized thoracolumbar spine. IMPRESSION: No evidence of acute cardiopulmonary disease. Electronically Signed   By: Charline Bills M.D.   On: 02/27/2016 22:14      Assessment & Plan: Mr. Jwan Hornbaker is a 82 y.o. white male with hypertension, diabetes mellitus type II, BPH, hyperlipidemia, peripheral vascular disease, who was admitted to [facility] on 02/27/2016   1. Acute renal failure on chronic kidney disease stage IV with proteinuria and metabolic acidosis: acute renal failure versus progression of disease. With hypotension, fever and SIRS. Chronic kidney disease secondary to diabetic nephropathy. Not currently on an ACE-I/ARB as outpatient.  - Check renal ultrasound - Agree with gentle IV fluids - Sodium bicarbonate  2. SIRS: empirically on levofloxacin. No leukocytosis. Cultures pending.   3. Hypertension:  - restarted on amlodipine and metorpolol.   4. Diabetes mellitus type II with chronic kidney disease:  - Continue glucose control.      LOS: 0 Vergene Marland 8/23/20174:01 PM

## 2016-02-29 ENCOUNTER — Inpatient Hospital Stay: Payer: Medicare Other

## 2016-02-29 LAB — BASIC METABOLIC PANEL
ANION GAP: 5 (ref 5–15)
BUN: 48 mg/dL — ABNORMAL HIGH (ref 6–20)
CO2: 26 mmol/L (ref 22–32)
Calcium: 8.5 mg/dL — ABNORMAL LOW (ref 8.9–10.3)
Chloride: 111 mmol/L (ref 101–111)
Creatinine, Ser: 3.45 mg/dL — ABNORMAL HIGH (ref 0.61–1.24)
GFR, EST AFRICAN AMERICAN: 17 mL/min — AB (ref >60)
GFR, EST NON AFRICAN AMERICAN: 14 mL/min — AB (ref >60)
GLUCOSE: 50 mg/dL — AB (ref 65–99)
POTASSIUM: 4.2 mmol/L (ref 3.5–5.1)
Sodium: 142 mmol/L (ref 135–145)

## 2016-02-29 LAB — GLUCOSE, CAPILLARY
GLUCOSE-CAPILLARY: 105 mg/dL — AB (ref 65–99)
GLUCOSE-CAPILLARY: 150 mg/dL — AB (ref 65–99)
GLUCOSE-CAPILLARY: 166 mg/dL — AB (ref 65–99)
GLUCOSE-CAPILLARY: 170 mg/dL — AB (ref 65–99)
GLUCOSE-CAPILLARY: 170 mg/dL — AB (ref 65–99)
GLUCOSE-CAPILLARY: 60 mg/dL — AB (ref 65–99)
GLUCOSE-CAPILLARY: 86 mg/dL (ref 65–99)
GLUCOSE-CAPILLARY: 88 mg/dL (ref 65–99)
GLUCOSE-CAPILLARY: 99 mg/dL (ref 65–99)
Glucose-Capillary: 111 mg/dL — ABNORMAL HIGH (ref 65–99)

## 2016-02-29 LAB — HEMOGLOBIN A1C: Hgb A1c MFr Bld: 6.7 % — ABNORMAL HIGH (ref 4.0–6.0)

## 2016-02-29 MED ORDER — IPRATROPIUM-ALBUTEROL 0.5-2.5 (3) MG/3ML IN SOLN
3.0000 mL | Freq: Four times a day (QID) | RESPIRATORY_TRACT | Status: DC
Start: 2016-02-29 — End: 2016-03-02
  Administered 2016-02-29 – 2016-03-02 (×8): 3 mL via RESPIRATORY_TRACT
  Filled 2016-02-29 (×8): qty 3

## 2016-02-29 MED ORDER — DEXTROSE-NACL 5-0.9 % IV SOLN
INTRAVENOUS | Status: DC
  Administered 2016-02-29: 08:00:00 via INTRAVENOUS

## 2016-02-29 MED ORDER — DEXTROSE 50 % IV SOLN
25.0000 g | Freq: Once | INTRAVENOUS | Status: AC
  Administered 2016-02-29: 25 g via INTRAVENOUS
  Filled 2016-02-29: qty 50

## 2016-02-29 MED ORDER — BUDESONIDE 0.25 MG/2ML IN SUSP
0.2500 mg | Freq: Two times a day (BID) | RESPIRATORY_TRACT | Status: DC
  Administered 2016-02-29 – 2016-03-04 (×6): 0.25 mg via RESPIRATORY_TRACT
  Filled 2016-02-29 (×7): qty 2

## 2016-02-29 MED ORDER — FUROSEMIDE 10 MG/ML IJ SOLN
100.0000 mg | Freq: Once | INTRAVENOUS | Status: AC
  Administered 2016-02-29: 100 mg via INTRAVENOUS
  Filled 2016-02-29: qty 10

## 2016-02-29 MED ORDER — ZIPRASIDONE MESYLATE 20 MG IM SOLR
10.0000 mg | Freq: Once | INTRAMUSCULAR | Status: AC
  Administered 2016-02-29: 10 mg via INTRAMUSCULAR
  Filled 2016-02-29: qty 20

## 2016-02-29 MED ORDER — QUETIAPINE FUMARATE 25 MG PO TABS
12.5000 mg | ORAL_TABLET | Freq: Every day | ORAL | Status: DC
Start: 2016-02-29 — End: 2016-03-01
  Administered 2016-02-29: 23:00:00 12.5 mg via ORAL
  Filled 2016-02-29: qty 1

## 2016-02-29 NOTE — Progress Notes (Signed)
Patient ID: Rodney Valencia, male   DOB: [birth year], 82 y.o.   MRN: [id number]  [employer] [facility] PROGRESS NOTE  Rodney Valencia NWG:[id number] DOB: June 19, 1924 DOA: 02/27/2016 PCP: No PCP Per Patient  HPI/Subjective: Patient had a fall yesterday afternoon. Feels a loss short of breath and wheezing. Some cough. States he's been having some problems with her sugars lately.  Objective: Vitals:   02/29/16 0822 02/29/16 1157  BP: (!) 157/66 (!) 149/54  Pulse: 69 65  Resp: (!) 24 20  Temp: 98.1 F (36.7 C) 98.2 F (36.8 C)    Filed Weights   02/27/16 2143 02/27/16 2149 02/28/16 0314  Weight: 72.1 kg (159 lb) 74.2 kg (163 lb 9.6 oz) 71.1 kg (156 lb 11.2 oz)    ROS: Review of Systems  Constitutional: Negative for chills and fever.  Eyes: Negative for blurred vision.  Respiratory: Positive for cough, shortness of breath and wheezing.   Cardiovascular: Negative for chest pain.  Gastrointestinal: Negative for abdominal pain, constipation, diarrhea, nausea and vomiting.  Genitourinary: Negative for dysuria.  Musculoskeletal: Negative for joint pain.  Neurological: Negative for dizziness and headaches.   Exam: Physical Exam  Constitutional: He is oriented to person, place, and time.  HENT:  Nose: No mucosal edema.  Mouth/Throat: No oropharyngeal exudate or posterior oropharyngeal edema.  Eyes: Conjunctivae, EOM and lids are normal. Pupils are equal, round, and reactive to light.  Neck: No JVD present. Carotid bruit is not present. No edema present. No thyroid mass and no thyromegaly present.  Cardiovascular: S1 normal and S2 normal.  Exam reveals no gallop.   No murmur heard. Pulses:      Dorsalis pedis pulses are 2+ on the right side, and 2+ on the left side.  Respiratory: No respiratory distress. He has decreased breath sounds in the right lower field and the left lower field. He has no wheezes. He has no rhonchi. He has no rales.  GI: Soft. Bowel sounds are normal. There is no  tenderness.  Musculoskeletal:       Right ankle: He exhibits swelling.       Left ankle: He exhibits swelling.  Lymphadenopathy:    He has no cervical adenopathy.  Neurological: He is alert and oriented to person, place, and time. No cranial nerve deficit.  Skin: Skin is warm. No rash noted. Nails show no clubbing.  Psychiatric: He has a normal mood and affect.      Data Reviewed: Basic Metabolic Panel:  Recent Labs Lab 02/27/16 2154 02/28/16 0411 02/29/16 0542  NA 138 141 142  K 4.8 4.1 4.2  CL 110 112* 111  CO2 23 22 26   GLUCOSE 153* 69 50*  BUN 52* 47* 48*  CREATININE 3.61* 3.58* 3.45*  CALCIUM 7.9* 7.8* 8.5*   Liver Function Tests:  Recent Labs Lab 02/27/16 2154  AST 20  ALT 15*  ALKPHOS 86  BILITOT 0.3  PROT 5.9*  ALBUMIN 3.5   CBC:  Recent Labs Lab 02/27/16 2154 02/28/16 0411  WBC 9.9 10.2  NEUTROABS 7.3*  --   HGB 9.8* 9.8*  HCT 28.7* 28.7*  MCV 88.9 88.9  PLT 199 185    CBG:  Recent Labs Lab 02/28/16 2052 02/29/16 0650 02/29/16 0744 02/29/16 1014 02/29/16 1159  GLUCAP 125* 60* 88 170* 105*    Recent Results (from the past 240 hour(s))  Blood Culture (routine x 2)     Status: None (Preliminary result)   Collection Time: 02/27/16  9:54 PM  Result Value Ref Range  Status   Specimen Description BLOOD RIGHT ASSIST CONTROL  Final   Special Requests BOTTLES DRAWN AEROBIC AND ANAEROBIC 8CC  Final   Culture NO GROWTH 2 DAYS  Final   Report Status PENDING  Incomplete  Blood Culture (routine x 2)     Status: None (Preliminary result)   Collection Time: 02/27/16  9:54 PM  Result Value Ref Range Status   Specimen Description BLOOD LEFT FATTY CASTS  Final   Special Requests BOTTLES DRAWN AEROBIC AND ANAEROBIC 5CC  Final   Culture NO GROWTH 2 DAYS  Final   Report Status PENDING  Incomplete  Culture, expectorated sputum-assessment     Status: None   Collection Time: 02/27/16 11:29 PM  Result Value Ref Range Status   Specimen Description  SPUTUM  Final   Special Requests NONE  Final   Sputum evaluation   Final    Sputum specimen not acceptable for testing.  Please recollect.   SHEY BREWER @1040  ON 02/28/16 BY [facility]KP    Report Status 02/28/2016 FINAL  Final  MRSA PCR Screening     Status: None   Collection Time: 02/28/16  3:35 AM  Result Value Ref Range Status   MRSA by PCR NEGATIVE NEGATIVE Final    Comment:        The GeneXpert MRSA Assay (FDA approved for NASAL specimens only), is one component of a comprehensive MRSA colonization surveillance program. It is not intended to diagnose MRSA infection nor to guide or monitor treatment for MRSA infections.      Studies: Dg Chest 1 View  Result Date: 02/29/2016 CLINICAL DATA:  Fever. EXAM: CHEST 1 VIEW COMPARISON:  02/27/2016 FINDINGS: The cardiac silhouette is enlarged. Mediastinal contours appear intact. There is no evidence of focal airspace consolidation, pleural effusion or pneumothorax. Mildly increased prominence of the interstitial markings. Osseous structures are without acute abnormality. Soft tissues are grossly normal. IMPRESSION: Mildly increased prominence of the interstitial markings which may be seen with hypoventilation or mild interstitial pulmonary edema. Enlarged cardiac silhouette, which may be partially accounted for by AP technique. Electronically Signed   By: Ted Mcalpineobrinka  Dimitrova M.D.   On: 02/29/2016 07:08   Dg Chest 2 View  Result Date: 02/27/2016 CLINICAL DATA:  Fever, productive cough EXAM: CHEST  2 VIEW COMPARISON:  None. FINDINGS: Lungs are essentially clear. No focal consolidation. No pleural effusion or pneumothorax. The heart is normal in size. Degenerative changes of the visualized thoracolumbar spine. IMPRESSION: No evidence of acute cardiopulmonary disease. Electronically Signed   By: Charline BillsSriyesh  Krishnan M.D.   On: 02/27/2016 22:14   [place] Renal  Result Date: 02/28/2016 CLINICAL DATA:  Acute on chronic renal failure. EXAM: RENAL / URINARY  TRACT ULTRASOUND COMPLETE COMPARISON:  CT abdomen and pelvis 11/23/2015. FINDINGS: Right Kidney: Length: 9.5 cm. Cortical echogenicity is increased and the cortex is markedly thinned. 2 simple cysts are identified. The larger measures 1.6 cm. No hydronephrosis. Left Kidney: Length: 10.9 cm. Cortex is echogenic and markedly thinned. Two simple cysts are identified. The larger measures 2.8 cm. No hydronephrosis. Bladder: Appears normal for degree of bladder distention. IMPRESSION: Negative for hydronephrosis. Senescent change and findings consistent with medical renal disease. Electronically Signed   By: Drusilla Kannerhomas  Dalessio M.D.   On: 02/28/2016 18:03    Scheduled Meds: . amLODipine  10 mg Oral Daily  . aspirin EC  81 mg Oral Daily  . atorvastatin  10 mg Oral QHS  . budesonide (PULMICORT) nebulizer solution  0.25 mg Nebulization BID  .  cholecalciferol  1,000 Units Oral Daily  . docusate sodium  100 mg Oral BID  . gabapentin  100 mg Oral BID  . heparin  5,000 Units Subcutaneous Q8H  . ipratropium-albuterol  3 mL Nebulization Q6H  . [START ON 03/01/2016] levofloxacin (LEVAQUIN) IV  250 mg Intravenous Q48H  . Melatonin  2.5 mg Oral QHS  . metoprolol succinate  25 mg Oral Daily  . pantoprazole  40 mg Oral Daily  . polyethylene glycol  17 g Oral Daily  . sodium bicarbonate  650 mg Oral BID  . tamsulosin  0.4 mg Oral Daily    Assessment/Plan:  1. Fluid overload with wheeze. I stopped the IV fluid hydration and gave 1 dose of Lasix and nebulizer treatments.  2. Hypoglycemia with diabetes. Glipizide and Lantus on hold. Since the patient was fluid overloaded I'm hesitant on more IV fluids. I did start D5 drip this morning. Fingerstick every 2 hours. Hemoglobin A1c still pending. 3. Systemic inflammatory response syndrome. Unclear what I'm treating at this point. Continue Levaquin. So far blood cultures are negative. Urinalysis unremarkable. Not quite sure if there is an infection. 4. Acute  encephalopathy. Improved 5. Chronic kidney disease stage IV. Nephrology consultation appreciated 6. Weakness PT evaluation. Fall last night. 7. Hyperlipidemia unspecified on atorvastatin 8. GERD on Protonix 9. BPH on Flomax  Code Status:     Code Status Orders        Start     Ordered   02/28/16 0307  Full code  Continuous     02/28/16 0306    Code Status History    Date Active Date Inactive Code Status Order ID Comments User Context   This patient has a current code status but no historical code status.     Disposition Plan: TBD  Time spent: 25 minutes  Alford HighlandWIETING, Kery Batzel  [employer]

## 2016-02-29 NOTE — Progress Notes (Signed)
PT Cancellation Note  Patient Details Name: Rodney Valencia MRN: [id number] DOB: 1924/06/15   Cancelled Treatment:    Reason Eval/Treat Not Completed: Patient not medically ready. Chart reviewed, RN consulted. Subjective information collected from friend of patient in room. Pt remains very confused, appears to be hallucinating, but not responding to conversation in such a way that allows me establish his motives while reaching out to remove objects from the air. Will attempt at later date/time as appropriate.    3:52 PM, 02/29/16 Rosamaria LintsAllan C Licet Dunphy, PT, DPT Physical Therapist - [place] [phone](ASCOM)  [phone] (mobile)

## 2016-02-29 NOTE — Progress Notes (Signed)
Inpatient Diabetes Program Recommendations  AACE/ADA: New Consensus Statement on Inpatient Glycemic Control (2015)  Target Ranges:  Prepandial:   less than 140 mg/dL      Peak postprandial:   less than 180 mg/dL (1-2 hours)      Critically ill patients:  140 - 180 mg/dL  Results for [name], Rodney (MRN [id number]) as of 02/29/2016 09:10  Ref. Range 02/28/2016 08:01 02/28/2016 12:00 02/28/2016 18:37 02/28/2016 18:40 02/28/2016 19:03 02/28/2016 20:52 02/29/2016 06:50 02/29/2016 07:44  Glucose-Capillary Latest Ref Range: 65 - 99 mg/dL 79 76 61 (L) 56 (L) 79 125 (H) 60 (L) 88    Review of Glycemic Control  Diabetes history: DM2 Outpatient Diabetes medications: Lantus 7 units daily, Glipizide XL 5 mg QAM Current orders for Inpatient glycemic control: Novolog 0-9 units TID with meals, Novolog 0-5 units QHS  Inpatient Diabetes Program Recommendations: Insulin - Basal: Fasting glucose 60 mg/dl this morning. Noted Glipizide and Lantus was discontinued after patient received both yesterday. Agree with using Novolog correction scale only for inpatient glycemic control at this time. Will continue to follow.  Thanks, Orlando PennerMarie Willys Salvino, RN, MSN, CDE Diabetes Coordinator Inpatient Diabetes Program [phone] (Team Pager from 8am to 5pm) [phone] ([facility] office) [phone] [facility] office) [phone] [facility] office)

## 2016-02-29 NOTE — Plan of Care (Signed)
Problem: Safety: Goal: Ability to remain free from injury will improve Outcome: Progressing Patient in low bed; floor mats are being utilized. Nonskid yellow socks on. Patient placed close to the nursing station; safety rounder in place.   Problem: Pain Managment: Goal: General experience of comfort will improve Outcome: Progressing No complaints of pain.   Problem: Activity: Goal: Risk for activity intolerance will decrease Outcome: Progressing Awaiting PT evaluation.

## 2016-02-29 NOTE — Progress Notes (Signed)
Rounding performed with Dr. Renae GlossWieting. Plan of care discussed with patient. Bo McclintockBrewer,Ocia Simek S, RN

## 2016-02-29 NOTE — Progress Notes (Signed)
[facility]  ROUNDING NOTE   Subjective:   Laying in bed.  Creatinine 3.45 (3.58)  Objective:  Vital signs in last 24 hours:  Temp:  [97.6 F (36.4 C)-98.7 F (37.1 C)] 98.2 F (36.8 C) (08/24 1157) Pulse Rate:  [65-106] 65 (08/24 1157) Resp:  [20-24] 20 (08/24 1157) BP: (143-178)/(46-80) 149/54 (08/24 1157) SpO2:  [87 %-94 %] 94 % (08/24 1157)  Weight change:  Filed Weights   02/27/16 2143 02/27/16 2149 02/28/16 0314  Weight: 72.1 kg (159 lb) 74.2 kg (163 lb 9.6 oz) 71.1 kg (156 lb 11.2 oz)    Intake/Output: I/O last 3 completed shifts: In: 1417.8 [P.O.:480; I.V.:937.8] Out: 1000 [Urine:1000]   Intake/Output this shift:  Total I/O In: 340.8 [P.O.:240; I.V.:100.8] Out: 100 [Urine:100]  Physical Exam: General: NAD, laying in bed  Head: +hard of hearing  Eyes: Anicteric, PERRL  Neck: Supple, trachea midline  Lungs:  Clear to auscultation  Heart: Regular rate and rhythm  Abdomen:  Soft, nontender,   Extremities: no peripheral edema.  Neurologic: Nonfocal, moving all four extremities  Skin: No lesions       Basic Metabolic Panel:  Recent Labs Lab 02/27/16 2154 02/28/16 0411 02/29/16 0542  NA 138 141 142  K 4.8 4.1 4.2  CL 110 112* 111  CO2 23 22 26   GLUCOSE 153* 69 50*  BUN 52* 47* 48*  CREATININE 3.61* 3.58* 3.45*  CALCIUM 7.9* 7.8* 8.5*    Liver Function Tests:  Recent Labs Lab 02/27/16 2154  AST 20  ALT 15*  ALKPHOS 86  BILITOT 0.3  PROT 5.9*  ALBUMIN 3.5   No results for input(s): LIPASE, AMYLASE in the last 168 hours. No results for input(s): AMMONIA in the last 168 hours.  CBC:  Recent Labs Lab 02/27/16 2154 02/28/16 0411  WBC 9.9 10.2  NEUTROABS 7.3*  --   HGB 9.8* 9.8*  HCT 28.7* 28.7*  MCV 88.9 88.9  PLT 199 185    Cardiac Enzymes: No results for input(s): CKTOTAL, CKMB, CKMBINDEX, TROPONINI in the last 168 hours.  BNP: Invalid input(s): POCBNP  CBG:  Recent Labs Lab 02/28/16 2052  02/29/16 0650 02/29/16 0744 02/29/16 1014 02/29/16 1159  GLUCAP 125* 60* 88 170* 105*    Microbiology: Results for orders placed or performed during the hospital encounter of 02/27/16  Blood Culture (routine x 2)     Status: None (Preliminary result)   Collection Time: 02/27/16  9:54 PM  Result Value Ref Range Status   Specimen Description BLOOD RIGHT ASSIST CONTROL  Final   Special Requests BOTTLES DRAWN AEROBIC AND ANAEROBIC 8CC  Final   Culture NO GROWTH 2 DAYS  Final   Report Status PENDING  Incomplete  Blood Culture (routine x 2)     Status: None (Preliminary result)   Collection Time: 02/27/16  9:54 PM  Result Value Ref Range Status   Specimen Description BLOOD LEFT FATTY CASTS  Final   Special Requests BOTTLES DRAWN AEROBIC AND ANAEROBIC 5CC  Final   Culture NO GROWTH 2 DAYS  Final   Report Status PENDING  Incomplete  Culture, expectorated sputum-assessment     Status: None   Collection Time: 02/27/16 11:29 PM  Result Value Ref Range Status   Specimen Description SPUTUM  Final   Special Requests NONE  Final   Sputum evaluation   Final    Sputum specimen not acceptable for testing.  Please recollect.   SHEY BREWER @1040  ON 02/28/16 BY HKP  Report Status 02/28/2016 FINAL  Final  MRSA PCR Screening     Status: None   Collection Time: 02/28/16  3:35 AM  Result Value Ref Range Status   MRSA by PCR NEGATIVE NEGATIVE Final    Comment:        The GeneXpert MRSA Assay (FDA approved for NASAL specimens only), is one component of a comprehensive MRSA colonization surveillance program. It is not intended to diagnose MRSA infection nor to guide or monitor treatment for MRSA infections.     Coagulation Studies: No results for input(s): LABPROT, INR in the last 72 hours.  Urinalysis:  Recent Labs  02/27/16 2154  COLORURINE YELLOW*  LABSPEC 1.014  PHURINE 5.0  GLUCOSEU 50*  HGBUR NEGATIVE  BILIRUBINUR NEGATIVE  KETONESUR NEGATIVE  PROTEINUR 100*  NITRITE  NEGATIVE  LEUKOCYTESUR NEGATIVE      Imaging: Dg Chest 1 View  Result Date: 02/29/2016 CLINICAL DATA:  Fever. EXAM: CHEST 1 VIEW COMPARISON:  02/27/2016 FINDINGS: The cardiac silhouette is enlarged. Mediastinal contours appear intact. There is no evidence of focal airspace consolidation, pleural effusion or pneumothorax. Mildly increased prominence of the interstitial markings. Osseous structures are without acute abnormality. Soft tissues are grossly normal. IMPRESSION: Mildly increased prominence of the interstitial markings which may be seen with hypoventilation or mild interstitial pulmonary edema. Enlarged cardiac silhouette, which may be partially accounted for by AP technique. Electronically Signed   By: Ted Mcalpineobrinka  Dimitrova M.D.   On: 02/29/2016 07:08   Dg Chest 2 View  Result Date: 02/27/2016 CLINICAL DATA:  Fever, productive cough EXAM: CHEST  2 VIEW COMPARISON:  None. FINDINGS: Lungs are essentially clear. No focal consolidation. No pleural effusion or pneumothorax. The heart is normal in size. Degenerative changes of the visualized thoracolumbar spine. IMPRESSION: No evidence of acute cardiopulmonary disease. Electronically Signed   By: Charline BillsSriyesh  Krishnan M.D.   On: 02/27/2016 22:14   [place] Renal  Result Date: 02/28/2016 CLINICAL DATA:  Acute on chronic renal failure. EXAM: RENAL / URINARY TRACT ULTRASOUND COMPLETE COMPARISON:  CT abdomen and pelvis 11/23/2015. FINDINGS: Right Kidney: Length: 9.5 cm. Cortical echogenicity is increased and the cortex is markedly thinned. 2 simple cysts are identified. The larger measures 1.6 cm. No hydronephrosis. Left Kidney: Length: 10.9 cm. Cortex is echogenic and markedly thinned. Two simple cysts are identified. The larger measures 2.8 cm. No hydronephrosis. Bladder: Appears normal for degree of bladder distention. IMPRESSION: Negative for hydronephrosis. Senescent change and findings consistent with medical renal disease. Electronically Signed   By:  Drusilla Kannerhomas  Dalessio M.D.   On: 02/28/2016 18:03     Medications:   . dextrose 5 % and 0.9% NaCl Stopped (02/29/16 0957)   . amLODipine  10 mg Oral Daily  . aspirin EC  81 mg Oral Daily  . atorvastatin  10 mg Oral QHS  . budesonide (PULMICORT) nebulizer solution  0.25 mg Nebulization BID  . cholecalciferol  1,000 Units Oral Daily  . docusate sodium  100 mg Oral BID  . gabapentin  100 mg Oral BID  . heparin  5,000 Units Subcutaneous Q8H  . ipratropium-albuterol  3 mL Nebulization Q6H  . [START ON 03/01/2016] levofloxacin (LEVAQUIN) IV  250 mg Intravenous Q48H  . Melatonin  2.5 mg Oral QHS  . metoprolol succinate  25 mg Oral Daily  . pantoprazole  40 mg Oral Daily  . polyethylene glycol  17 g Oral Daily  . sodium bicarbonate  650 mg Oral BID  . tamsulosin  0.4 mg Oral Daily  acetaminophen **OR** acetaminophen, meclizine, ondansetron **OR** ondansetron (ZOFRAN) IV  Assessment/ Plan:  Rodney Valencia is a 82 y.o. white male with hypertension, diabetes mellitus type II, BPH, hyperlipidemia, peripheral vascular disease, who was admitted to [facility] on 02/27/2016   1. Acute renal failure on chronic kidney disease stage IV with proteinuria and metabolic acidosis: acute renal failure versus progression of disease. With hypotension, fever and SIRS. Chronic kidney disease secondary to diabetic nephropathy. Not currently on an ACE-I/ARB as outpatient.  Renal ultrasound negative for obstruction - Sodium bicarbonate  2. SIRS: empirically on levofloxacin.   3. Hypertension:  - restarted on amlodipine and metorpolol.   4. Diabetes mellitus type II with chronic kidney disease:  - Continue glucose control.    LOS: 1 Rodney Valencia 8/24/20171:52 PM

## 2016-03-01 ENCOUNTER — Inpatient Hospital Stay: Payer: Medicare Other

## 2016-03-01 LAB — GLUCOSE, CAPILLARY
GLUCOSE-CAPILLARY: 122 mg/dL — AB (ref 65–99)
GLUCOSE-CAPILLARY: 112 mg/dL — AB (ref 65–99)
GLUCOSE-CAPILLARY: 97 mg/dL (ref 65–99)
GLUCOSE-CAPILLARY: 93 mg/dL (ref 65–99)
GLUCOSE-CAPILLARY: 134 mg/dL — AB (ref 65–99)
Glucose-Capillary: 102 mg/dL — ABNORMAL HIGH (ref 65–99)
Glucose-Capillary: 103 mg/dL — ABNORMAL HIGH (ref 65–99)
Glucose-Capillary: 110 mg/dL — ABNORMAL HIGH (ref 65–99)
Glucose-Capillary: 131 mg/dL — ABNORMAL HIGH (ref 65–99)
Glucose-Capillary: 139 mg/dL — ABNORMAL HIGH (ref 65–99)

## 2016-03-01 MED ORDER — QUETIAPINE FUMARATE 25 MG PO TABS
25.0000 mg | ORAL_TABLET | Freq: Every day | ORAL | Status: DC
  Administered 2016-03-02 – 2016-03-03 (×2): 25 mg via ORAL
  Filled 2016-03-01 (×3): qty 1

## 2016-03-01 MED ORDER — LEVOFLOXACIN 500 MG PO TABS
250.0000 mg | ORAL_TABLET | ORAL | Status: DC
  Administered 2016-03-03: 11:00:00 250 mg via ORAL
  Filled 2016-03-01: qty 1

## 2016-03-01 MED ORDER — HALOPERIDOL LACTATE 5 MG/ML IJ SOLN
2.0000 mg | Freq: Four times a day (QID) | INTRAMUSCULAR | Status: DC | PRN
  Administered 2016-03-03: 2 mg via INTRAMUSCULAR
  Filled 2016-03-01: qty 1

## 2016-03-01 MED ORDER — DIPHENHYDRAMINE HCL 50 MG/ML IJ SOLN
25.0000 mg | Freq: Once | INTRAMUSCULAR | Status: AC
  Administered 2016-03-01: 25 mg via INTRAVENOUS
  Filled 2016-03-01: qty 1

## 2016-03-01 NOTE — Progress Notes (Signed)
Palliative Medicine consult noted. Due to high referral volume, there may be a delay seeing this patient. Please call the Palliative Medicine Team office at [phone] if recommendations are needed in the interim.  Thank you for inviting us to see this patient.  Margret ChanceMelanie G. Fischer Halley, RN, BSN, [facility] 03/01/2016 11:14 AM Cell [phone] 8:00-4:00 Monday-Friday Office [phone]

## 2016-03-01 NOTE — Progress Notes (Signed)
Patient ID: Rodney Valencia, male   DOB: Oct 08, 1923, 82 y.o.   MRN: [id number]  [employer] PROGRESS NOTE  Rodney Valencia KGM:[id number] DOB: [birth year] DOA: 02/27/2016 PCP: No PCP Per Patient  HPI/Subjective: Patient given a sternal rub today. He was able to mumble little bit but not much response.  Objective: Vitals:   03/01/16 0845 03/01/16 1258  BP: (!) 155/72 (!) 135/53  Pulse: (!) 112 66  Resp: 20 18  Temp: 98.5 F (36.9 C) 97 F (36.1 C)    Filed Weights   02/27/16 2143 02/27/16 2149 02/28/16 0314  Weight: 72.1 kg (159 lb) 74.2 kg (163 lb 9.6 oz) 71.1 kg (156 lb 11.2 oz)    ROS: Review of Systems  Unable to perform ROS: Acuity of condition   Exam: Physical Exam  Constitutional: He appears lethargic.  HENT:  Nose: No mucosal edema.  Mouth/Throat: No oropharyngeal exudate or posterior oropharyngeal edema.  Eyes: Conjunctivae, EOM and lids are normal. Pupils are equal, round, and reactive to light.  Neck: No JVD present. Carotid bruit is not present. No edema present. No thyroid mass and no thyromegaly present.  Cardiovascular: S1 normal and S2 normal.  Exam reveals no gallop.   No murmur heard. Pulses:      Dorsalis pedis pulses are 2+ on the right side, and 2+ on the left side.  Respiratory: No respiratory distress. He has decreased breath sounds in the right lower field and the left lower field. He has wheezes in the right middle field, the right lower field, the left middle field and the left lower field. He has no rhonchi. He has no rales.  GI: Soft. Bowel sounds are normal. There is no tenderness.  Musculoskeletal:       Right ankle: He exhibits swelling.       Left ankle: He exhibits swelling.  Lymphadenopathy:    He has no cervical adenopathy.  Neurological: He appears lethargic.  Skin: Skin is warm. No rash noted. Nails show no clubbing.  Psychiatric:  Patient mumbled with sternal rub.      Data Reviewed: Basic Metabolic Panel:  Recent Labs Lab  02/27/16 2154 02/28/16 0411 02/29/16 0542  NA 138 141 142  K 4.8 4.1 4.2  CL 110 112* 111  CO2 23 22 26   GLUCOSE 153* 69 50*  BUN 52* 47* 48*  CREATININE 3.61* 3.58* 3.45*  CALCIUM 7.9* 7.8* 8.5*   Liver Function Tests:  Recent Labs Lab 02/27/16 2154  AST 20  ALT 15*  ALKPHOS 86  BILITOT 0.3  PROT 5.9*  ALBUMIN 3.5   CBC:  Recent Labs Lab 02/27/16 2154 02/28/16 0411  WBC 9.9 10.2  NEUTROABS 7.3*  --   HGB 9.8* 9.8*  HCT 28.7* 28.7*  MCV 88.9 88.9  PLT 199 185    CBG:  Recent Labs Lab 03/01/16 0626 03/01/16 0831 03/01/16 1027 03/01/16 1155 03/01/16 1510  GLUCAP 97 93 134* 103* 139*    Recent Results (from the past 240 hour(s))  Blood Culture (routine x 2)     Status: None (Preliminary result)   Collection Time: 02/27/16  9:54 PM  Result Value Ref Range Status   Specimen Description BLOOD RIGHT ASSIST CONTROL  Final   Special Requests BOTTLES DRAWN AEROBIC AND ANAEROBIC 8CC  Final   Culture NO GROWTH 3 DAYS  Final   Report Status PENDING  Incomplete  Blood Culture (routine x 2)     Status: None (Preliminary result)   Collection Time: 02/27/16  9:54  PM  Result Value Ref Range Status   Specimen Description BLOOD LEFT FATTY CASTS  Final   Special Requests BOTTLES DRAWN AEROBIC AND ANAEROBIC 5CC  Final   Culture NO GROWTH 3 DAYS  Final   Report Status PENDING  Incomplete  Culture, expectorated sputum-assessment     Status: None   Collection Time: 02/27/16 11:29 PM  Result Value Ref Range Status   Specimen Description SPUTUM  Final   Special Requests NONE  Final   Sputum evaluation   Final    Sputum specimen not acceptable for testing.  Please recollect.   SHEY BREWER @1040  ON 02/28/16 BY HKP    Report Status 02/28/2016 FINAL  Final  MRSA PCR Screening     Status: None   Collection Time: 02/28/16  3:35 AM  Result Value Ref Range Status   MRSA by PCR NEGATIVE NEGATIVE Final    Comment:        The GeneXpert MRSA Assay (FDA approved for NASAL  specimens only), is one component of a comprehensive MRSA colonization surveillance program. It is not intended to diagnose MRSA infection nor to guide or monitor treatment for MRSA infections.      Studies: Dg Chest 1 View  Result Date: 02/29/2016 CLINICAL DATA:  Fever. EXAM: CHEST 1 VIEW COMPARISON:  02/27/2016 FINDINGS: The cardiac silhouette is enlarged. Mediastinal contours appear intact. There is no evidence of focal airspace consolidation, pleural effusion or pneumothorax. Mildly increased prominence of the interstitial markings. Osseous structures are without acute abnormality. Soft tissues are grossly normal. IMPRESSION: Mildly increased prominence of the interstitial markings which may be seen with hypoventilation or mild interstitial pulmonary edema. Enlarged cardiac silhouette, which may be partially accounted for by AP technique. Electronically Signed   By: Ted Mcalpineobrinka  Dimitrova M.D.   On: 02/29/2016 07:08   [place] Renal  Result Date: 02/28/2016 CLINICAL DATA:  Acute on chronic renal failure. EXAM: RENAL / URINARY TRACT ULTRASOUND COMPLETE COMPARISON:  CT abdomen and pelvis 11/23/2015. FINDINGS: Right Kidney: Length: 9.5 cm. Cortical echogenicity is increased and the cortex is markedly thinned. 2 simple cysts are identified. The larger measures 1.6 cm. No hydronephrosis. Left Kidney: Length: 10.9 cm. Cortex is echogenic and markedly thinned. Two simple cysts are identified. The larger measures 2.8 cm. No hydronephrosis. Bladder: Appears normal for degree of bladder distention. IMPRESSION: Negative for hydronephrosis. Senescent change and findings consistent with medical renal disease. Electronically Signed   By: Drusilla Kannerhomas  Dalessio M.D.   On: 02/28/2016 18:03    Scheduled Meds: . amLODipine  10 mg Oral Daily  . aspirin EC  81 mg Oral Daily  . budesonide (PULMICORT) nebulizer solution  0.25 mg Nebulization BID  . docusate sodium  100 mg Oral BID  . heparin  5,000 Units Subcutaneous Q8H   . ipratropium-albuterol  3 mL Nebulization Q6H  . [START ON 03/03/2016] levofloxacin  250 mg Oral Q48H  . Melatonin  2.5 mg Oral QHS  . metoprolol succinate  25 mg Oral Daily  . pantoprazole  40 mg Oral Daily  . polyethylene glycol  17 g Oral Daily  . QUEtiapine  25 mg Oral QHS  . sodium bicarbonate  650 mg Oral BID  . tamsulosin  0.4 mg Oral Daily    Assessment/Plan:  1. Acute delirium on top of dementia. Haldol when necessary. Seroquel at night. 2. Hypoglycemia with diabetes. Glipizide and Lantus on hold. Check fingersticks every 4. 3. Systemic inflammatory response syndrome. Likely lung source with wheezing. Continue Levaquin. So far blood  cultures are negative. Urinalysis unremarkable.  4. Asthmatic bronchitis. Continue nebulizer treatments with budesonide. 5. Chronic kidney disease stage IV. Nephrology consultation appreciated 6. Weakness.  7. Hyperlipidemia unspecified stop atorvastatin 8. GERD on Protonix 9. BPH on Flomax 10. I spoke with power of attorney Adrian Prows at [phone] and made the patient a DO NOT RESUSCITATE.  Code Status: Patient made a DO NOT RESUSCITATE    Code Status Orders        Start     Ordered   02/28/16 0307  Full code  Continuous     02/28/16 0306    Code Status History    Date Active Date Inactive Code Status Order ID Comments User Context   This patient has a current code status but no historical code status.     Disposition Plan: TBD  Time spent: 25 minutes  Alford Highland  [employer]

## 2016-03-01 NOTE — Care Management Important Message (Signed)
Important Message  Patient Details  Name: Rodney Valencia MRN: [id number] Date of Birth: Nov 16, 1923   Medicare Important Message Given:  Yes    Gwenette GreetBrenda S Azka Steger, RN 03/01/2016, 8:23 AM

## 2016-03-01 NOTE — Progress Notes (Signed)
Pharmacy Antibiotic Note  Fuller Canadaarl Pavlov is a 82 y.o. male admitted on 02/27/2016 with pneumonia.  Pharmacy has been consulted for levofloxacin dosing.  This is day #4 of antibiotics.  Plan: Continue levofloxacin 250 mg q 48 hours. Will change to PO.  Height: 5\' 5"  (165.1 cm) Weight: 156 lb 11.2 oz (71.1 kg) IBW/kg (Calculated) : 61.5  Temp (24hrs), Avg:99.1 F (37.3 C), Min:97.9 F (36.6 C), Max:101.4 F (38.6 C)   Recent Labs Lab 02/27/16 2154 02/28/16 0411 02/29/16 0542  WBC 9.9 10.2  --   CREATININE 3.61* 3.58* 3.45*  LATICACIDVEN 1.1 0.8  --     Estimated Creatinine Clearance: 12.4 mL/min (by C-G formula based on SCr of 3.45 mg/dL).    No Known Allergies   Antimicrobials this admission: Vancomycin and cefepime 8/22 >> 8/23 levofloxacin 8/23 >>   Dose adjustments this admission:  Microbiology results: 8/22 BCx: No growth 3 days 8/22 MRSA PCR: negative  Thank you for allowing pharmacy to be a part of this patient's care.  Cindi CarbonMary M Namir Neto, PharmD, BCPS 03/01/2016 8:37 AM

## 2016-03-01 NOTE — Progress Notes (Signed)
[facility]  ROUNDING NOTE   Subjective:   Laying in bed.  Furosemide IV 100mg  x one yesterday More confused today. Hypoglycemic this morning. Hallucinating actively  Objective:  Vital signs in last 24 hours:  Temp:  [97.9 F (36.6 C)-101.4 F (38.6 C)] 98.5 F (36.9 C) (08/25 0845) Pulse Rate:  [62-112] 112 (08/25 0845) Resp:  [16-22] 20 (08/25 0845) BP: (88-173)/(52-72) 155/72 (08/25 0845) SpO2:  [91 %-99 %] 91 % (08/25 0845) FiO2 (%):  [97 %] 97 % (08/24 1535)  Weight change:  Filed Weights   02/27/16 2143 02/27/16 2149 02/28/16 0314  Weight: 72.1 kg (159 lb) 74.2 kg (163 lb 9.6 oz) 71.1 kg (156 lb 11.2 oz)    Intake/Output: I/O last 3 completed shifts: In: 1153.3 [P.O.:600; I.V.:433.3; Other:120] Out: 520 [Urine:520]   Intake/Output this shift:  No intake/output data recorded.  Physical Exam: General: NAD, laying in bed  Head: +hard of hearing  Eyes: Anicteric, PERRL  Neck: Supple, trachea midline  Lungs:  Clear to auscultation  Heart: Regular rate and rhythm  Abdomen:  Soft, nontender,   Extremities: no peripheral edema.  Neurologic: Nonfocal, moving all four extremities  Skin: No lesions       Basic Metabolic Panel:  Recent Labs Lab 02/27/16 2154 02/28/16 0411 02/29/16 0542  NA 138 141 142  K 4.8 4.1 4.2  CL 110 112* 111  CO2 23 22 26   GLUCOSE 153* 69 50*  BUN 52* 47* 48*  CREATININE 3.61* 3.58* 3.45*  CALCIUM 7.9* 7.8* 8.5*    Liver Function Tests:  Recent Labs Lab 02/27/16 2154  AST 20  ALT 15*  ALKPHOS 86  BILITOT 0.3  PROT 5.9*  ALBUMIN 3.5   No results for input(s): LIPASE, AMYLASE in the last 168 hours. No results for input(s): AMMONIA in the last 168 hours.  CBC:  Recent Labs Lab 02/27/16 2154 02/28/16 0411  WBC 9.9 10.2  NEUTROABS 7.3*  --   HGB 9.8* 9.8*  HCT 28.7* 28.7*  MCV 88.9 88.9  PLT 199 185    Cardiac Enzymes: No results for input(s): CKTOTAL, CKMB, CKMBINDEX, TROPONINI in the last 168  hours.  BNP: Invalid input(s): POCBNP  CBG:  Recent Labs Lab 03/01/16 0153 03/01/16 0425 03/01/16 0626 03/01/16 0831 03/01/16 1027  GLUCAP 122* 112* 97 93 134*    Microbiology: Results for orders placed or performed during the hospital encounter of 02/27/16  Blood Culture (routine x 2)     Status: None (Preliminary result)   Collection Time: 02/27/16  9:54 PM  Result Value Ref Range Status   Specimen Description BLOOD RIGHT ASSIST CONTROL  Final   Special Requests BOTTLES DRAWN AEROBIC AND ANAEROBIC 8CC  Final   Culture NO GROWTH 3 DAYS  Final   Report Status PENDING  Incomplete  Blood Culture (routine x 2)     Status: None (Preliminary result)   Collection Time: 02/27/16  9:54 PM  Result Value Ref Range Status   Specimen Description BLOOD LEFT FATTY CASTS  Final   Special Requests BOTTLES DRAWN AEROBIC AND ANAEROBIC 5CC  Final   Culture NO GROWTH 3 DAYS  Final   Report Status PENDING  Incomplete  Culture, expectorated sputum-assessment     Status: None   Collection Time: 02/27/16 11:29 PM  Result Value Ref Range Status   Specimen Description SPUTUM  Final   Special Requests NONE  Final   Sputum evaluation   Final    Sputum specimen not acceptable for  testing.  Please recollect.   SHEY BREWER @1040  ON 02/28/16 BY HKP    Report Status 02/28/2016 FINAL  Final  MRSA PCR Screening     Status: None   Collection Time: 02/28/16  3:35 AM  Result Value Ref Range Status   MRSA by PCR NEGATIVE NEGATIVE Final    Comment:        The GeneXpert MRSA Assay (FDA approved for NASAL specimens only), is one component of a comprehensive MRSA colonization surveillance program. It is not intended to diagnose MRSA infection nor to guide or monitor treatment for MRSA infections.     Coagulation Studies: No results for input(s): LABPROT, INR in the last 72 hours.  Urinalysis:  Recent Labs  02/27/16 2154  COLORURINE YELLOW*  LABSPEC 1.014  PHURINE 5.0  GLUCOSEU 50*  HGBUR  NEGATIVE  BILIRUBINUR NEGATIVE  KETONESUR NEGATIVE  PROTEINUR 100*  NITRITE NEGATIVE  LEUKOCYTESUR NEGATIVE      Imaging: Dg Chest 1 View  Result Date: 02/29/2016 CLINICAL DATA:  Fever. EXAM: CHEST 1 VIEW COMPARISON:  02/27/2016 FINDINGS: The cardiac silhouette is enlarged. Mediastinal contours appear intact. There is no evidence of focal airspace consolidation, pleural effusion or pneumothorax. Mildly increased prominence of the interstitial markings. Osseous structures are without acute abnormality. Soft tissues are grossly normal. IMPRESSION: Mildly increased prominence of the interstitial markings which may be seen with hypoventilation or mild interstitial pulmonary edema. Enlarged cardiac silhouette, which may be partially accounted for by AP technique. Electronically Signed   By: Ted Mcalpine M.D.   On: 02/29/2016 07:08   US Renal  Result Date: 02/28/2016 CLINICAL DATA:  Acute on chronic renal failure. EXAM: RENAL / URINARY TRACT ULTRASOUND COMPLETE COMPARISON:  CT abdomen and pelvis 11/23/2015. FINDINGS: Right Kidney: Length: 9.5 cm. Cortical echogenicity is increased and the cortex is markedly thinned. 2 simple cysts are identified. The larger measures 1.6 cm. No hydronephrosis. Left Kidney: Length: 10.9 cm. Cortex is echogenic and markedly thinned. Two simple cysts are identified. The larger measures 2.8 cm. No hydronephrosis. Bladder: Appears normal for degree of bladder distention. IMPRESSION: Negative for hydronephrosis. Senescent change and findings consistent with medical renal disease. Electronically Signed   By: Drusilla Kanner M.D.   On: 02/28/2016 18:03     Medications:     . amLODipine  10 mg Oral Daily  . aspirin EC  81 mg Oral Daily  . budesonide (PULMICORT) nebulizer solution  0.25 mg Nebulization BID  . docusate sodium  100 mg Oral BID  . heparin  5,000 Units Subcutaneous Q8H  . ipratropium-albuterol  3 mL Nebulization Q6H  . [START ON 03/03/2016]  levofloxacin  250 mg Oral Q48H  . Melatonin  2.5 mg Oral QHS  . metoprolol succinate  25 mg Oral Daily  . pantoprazole  40 mg Oral Daily  . polyethylene glycol  17 g Oral Daily  . QUEtiapine  25 mg Oral QHS  . sodium bicarbonate  650 mg Oral BID  . tamsulosin  0.4 mg Oral Daily   acetaminophen **OR** acetaminophen, haloperidol lactate, ondansetron **OR** ondansetron (ZOFRAN) IV  Assessment/ Plan:  Mr. Rodney Valencia is a 82 y.o. white male with hypertension, diabetes mellitus type II, BPH, hyperlipidemia, peripheral vascular disease, who was admitted to [facility] on 02/27/2016   1. Acute renal failure on chronic kidney disease stage IV with proteinuria and metabolic acidosis: acute renal failure versus progression of disease. With hypotension, fever and SIRS. Chronic kidney disease secondary to diabetic nephropathy. Not currently on an ACE-I/ARB  as outpatient.  Renal ultrasound negative for obstruction. No labs today. - Sodium bicarbonate  2. SIRS: empirically on levofloxacin.   3. Hypertension:  - restarted on amlodipine and metoprolol.   4. Diabetes mellitus type II with chronic kidney disease:  - Continue glucose control.    LOS: 2 Rodney Valencia 8/25/201711:33 AM

## 2016-03-01 NOTE — Progress Notes (Signed)
PT Cancellation Note  Patient Details Name: Rodney Valencia MRN: [id number] DOB: [birth year]   Cancelled Treatment:    Reason Eval/Treat Not Completed: Patient not medically ready.  Will see if pt is more alert and conversant, able to follow instructions.  He is speaking to people who are not there and pointing/reaching for objects, eyes closed and won't open them.   Ivar DrapeStout, Zenith Kercheval E 03/01/2016, 10:28 AM    Samul Dadauth Dao Mearns, PT MS Acute Rehab Dept. Number: [facility] [id number] and [facility] [phone]

## 2016-03-02 LAB — RENAL FUNCTION PANEL
ALBUMIN: 3 g/dL — AB (ref 3.5–5.0)
ANION GAP: 12 (ref 5–15)
BUN: 61 mg/dL — ABNORMAL HIGH (ref 6–20)
CALCIUM: 8.4 mg/dL — AB (ref 8.9–10.3)
CO2: 21 mmol/L — ABNORMAL LOW (ref 22–32)
Chloride: 110 mmol/L (ref 101–111)
Creatinine, Ser: 3.99 mg/dL — ABNORMAL HIGH (ref 0.61–1.24)
GFR, EST AFRICAN AMERICAN: 14 mL/min — AB (ref >60)
GFR, EST NON AFRICAN AMERICAN: 12 mL/min — AB (ref >60)
GLUCOSE: 112 mg/dL — AB (ref 65–99)
PHOSPHORUS: 5.1 mg/dL — AB (ref 2.5–4.6)
POTASSIUM: 4.3 mmol/L (ref 3.5–5.1)
SODIUM: 143 mmol/L (ref 135–145)

## 2016-03-02 LAB — GLUCOSE, CAPILLARY
GLUCOSE-CAPILLARY: 111 mg/dL — AB (ref 65–99)
GLUCOSE-CAPILLARY: 133 mg/dL — AB (ref 65–99)
GLUCOSE-CAPILLARY: 189 mg/dL — AB (ref 65–99)
Glucose-Capillary: 109 mg/dL — ABNORMAL HIGH (ref 65–99)
Glucose-Capillary: 121 mg/dL — ABNORMAL HIGH (ref 65–99)

## 2016-03-02 MED ORDER — GUAIFENESIN-DM 100-10 MG/5ML PO SYRP
5.0000 mL | ORAL_SOLUTION | ORAL | Status: DC | PRN
  Administered 2016-03-02: 5 mL via ORAL
  Filled 2016-03-02: qty 5

## 2016-03-02 MED ORDER — IPRATROPIUM-ALBUTEROL 0.5-2.5 (3) MG/3ML IN SOLN
3.0000 mL | Freq: Two times a day (BID) | RESPIRATORY_TRACT | Status: DC
  Administered 2016-03-02 – 2016-03-04 (×2): 3 mL via RESPIRATORY_TRACT
  Filled 2016-03-02 (×3): qty 3

## 2016-03-02 NOTE — Progress Notes (Signed)
1800: Patient spent day in room in bed. Confused at times. Very unco-operative in the morning but better in the afternoon.Had many visitors this afternoon. No complaints voiced. No distress noted.

## 2016-03-02 NOTE — Progress Notes (Signed)
[facility]  ROUNDING NOTE   Subjective:   Refusing breakfast and medications currently. Wants to speak to his friend Rodney MccreedyBarbara.   Objective:  Vital signs in last 24 hours:  Temp:  [97 F (36.1 C)-99.1 F (37.3 C)] 98 F (36.7 C) (08/26 0354) Pulse Rate:  [62-85] 80 (08/26 0922) Resp:  [18-20] 19 (08/26 0354) BP: (119-136)/(47-55) 119/47 (08/26 0922) SpO2:  [94 %-99 %] 96 % (08/26 0354)  Weight change:  Filed Weights   02/27/16 2143 02/27/16 2149 02/28/16 0314  Weight: 72.1 kg (159 lb) 74.2 kg (163 lb 9.6 oz) 71.1 kg (156 lb 11.2 oz)    Intake/Output: I/O last 3 completed shifts: In: 120 [Other:120] Out: 620 [Urine:620]   Intake/Output this shift:  No intake/output data recorded.  Physical Exam: General: NAD, laying in bed  Head: +hard of hearing  Eyes: Anicteric, PERRL  Neck: Supple, trachea midline  Lungs:  Clear to auscultation  Heart: Regular rate and rhythm  Abdomen:  Soft, nontender,   Extremities: no peripheral edema.  Neurologic: Nonfocal, moving all four extremities  Skin: No lesions       Basic Metabolic Panel:  Recent Labs Lab 02/27/16 2154 02/28/16 0411 02/29/16 0542 03/02/16 0506  NA 138 141 142 143  K 4.8 4.1 4.2 4.3  CL 110 112* 111 110  CO2 23 22 26  21*  GLUCOSE 153* 69 50* 112*  BUN 52* 47* 48* 61*  CREATININE 3.61* 3.58* 3.45* 3.99*  CALCIUM 7.9* 7.8* 8.5* 8.4*  PHOS  --   --   --  5.1*    Liver Function Tests:  Recent Labs Lab 02/27/16 2154 03/02/16 0506  AST 20  --   ALT 15*  --   ALKPHOS 86  --   BILITOT 0.3  --   PROT 5.9*  --   ALBUMIN 3.5 3.0*   No results for input(s): LIPASE, AMYLASE in the last 168 hours. No results for input(s): AMMONIA in the last 168 hours.  CBC:  Recent Labs Lab 02/27/16 2154 02/28/16 0411  WBC 9.9 10.2  NEUTROABS 7.3*  --   HGB 9.8* 9.8*  HCT 28.7* 28.7*  MCV 88.9 88.9  PLT 199 185    Cardiac Enzymes: No results for input(s): CKTOTAL, CKMB, CKMBINDEX, TROPONINI  in the last 168 hours.  BNP: Invalid input(s): POCBNP  CBG:  Recent Labs Lab 03/01/16 1712 03/01/16 2002 03/01/16 2351 03/02/16 0626 03/02/16 0815  GLUCAP 131* 110* 102* 111* 109*    Microbiology: Results for orders placed or performed during the hospital encounter of 02/27/16  Blood Culture (routine x 2)     Status: None (Preliminary result)   Collection Time: 02/27/16  9:54 PM  Result Value Ref Range Status   Specimen Description BLOOD RIGHT ASSIST CONTROL  Final   Special Requests BOTTLES DRAWN AEROBIC AND ANAEROBIC 8CC  Final   Culture NO GROWTH 4 DAYS  Final   Report Status PENDING  Incomplete  Blood Culture (routine x 2)     Status: None (Preliminary result)   Collection Time: 02/27/16  9:54 PM  Result Value Ref Range Status   Specimen Description BLOOD LEFT FATTY CASTS  Final   Special Requests BOTTLES DRAWN AEROBIC AND ANAEROBIC 5CC  Final   Culture NO GROWTH 4 DAYS  Final   Report Status PENDING  Incomplete  Culture, expectorated sputum-assessment     Status: None   Collection Time: 02/27/16 11:29 PM  Result Value Ref Range Status   Specimen Description SPUTUM  Final   Special Requests NONE  Final   Sputum evaluation   Final    Sputum specimen not acceptable for testing.  Please recollect.   SHEY BREWER @1040  ON 02/28/16 BY [facility]    Report Status 02/28/2016 FINAL  Final  MRSA PCR Screening     Status: None   Collection Time: 02/28/16  3:35 AM  Result Value Ref Range Status   MRSA by PCR NEGATIVE NEGATIVE Final    Comment:        The GeneXpert MRSA Assay (FDA approved for NASAL specimens only), is one component of a comprehensive MRSA colonization surveillance program. It is not intended to diagnose MRSA infection nor to guide or monitor treatment for MRSA infections.     Coagulation Studies: No results for input(s): LABPROT, INR in the last 72 hours.  Urinalysis: No results for input(s): COLORURINE, LABSPEC, PHURINE, GLUCOSEU, HGBUR, BILIRUBINUR,  KETONESUR, PROTEINUR, UROBILINOGEN, NITRITE, LEUKOCYTESUR in the last 72 hours.  Invalid input(s): APPERANCEUR    Imaging: Ct Head Wo Contrast  Result Date: 03/01/2016 CLINICAL DATA:  Confusion, hallucinations, altered mental status EXAM: CT HEAD WITHOUT CONTRAST TECHNIQUE: Contiguous axial images were obtained from the base of the skull through the vertex without intravenous contrast. COMPARISON:  11/26/2015 FINDINGS: Brain: No evidence of acute infarction, hemorrhage, extra-axial collection, ventriculomegaly, or mass effect. Generalized cerebral atrophy. Periventricular white matter low attenuation likely secondary to microangiopathy. Vascular: Cerebrovascular atherosclerotic calcifications are noted. Skull: Negative for fracture or focal lesion. Sinuses/Orbits: Visualized portions of the orbits are unremarkable. There is bilateral ethmoid sinus mucosal thickening. Other: None. IMPRESSION: 1. No acute intracranial pathology. 2. Chronic microvascular disease and cerebral atrophy. Electronically Signed   By: Elige Ko   On: 03/01/2016 19:59     Medications:     . amLODipine  10 mg Oral Daily  . aspirin EC  81 mg Oral Daily  . budesonide (PULMICORT) nebulizer solution  0.25 mg Nebulization BID  . docusate sodium  100 mg Oral BID  . heparin  5,000 Units Subcutaneous Q8H  . ipratropium-albuterol  3 mL Nebulization Q6H  . [START ON 03/03/2016] levofloxacin  250 mg Oral Q48H  . Melatonin  2.5 mg Oral QHS  . metoprolol succinate  25 mg Oral Daily  . pantoprazole  40 mg Oral Daily  . polyethylene glycol  17 g Oral Daily  . QUEtiapine  25 mg Oral QHS  . sodium bicarbonate  650 mg Oral BID  . tamsulosin  0.4 mg Oral Daily   acetaminophen **OR** acetaminophen, haloperidol lactate, ondansetron **OR** ondansetron (ZOFRAN) IV  Assessment/ Plan:  Rodney Valencia is a 82 y.o. white male with hypertension, diabetes mellitus type II, BPH, hyperlipidemia, peripheral vascular disease, who was  admitted to [facility] on 02/27/2016   1. Acute renal failure on chronic kidney disease stage IV with proteinuria and metabolic acidosis: acute renal failure versus progression of disease. With hypotension, fever and SIRS. Chronic kidney disease secondary to diabetic nephropathy. Not currently on an ACE-I/ARB as outpatient.  Renal ultrasound negative for obstruction. - Sodium bicarbonate  2. SIRS: empirically on levofloxacin.   3. Hypertension: blood pressure at goal.  - restarted on amlodipine and metoprolol.   4. Diabetes mellitus type II with chronic kidney disease:  - Continue glucose control.    LOS: 3 Rodney Valencia 8/26/201710:50 AM

## 2016-03-02 NOTE — Progress Notes (Signed)
09:30 Patient awake, alert. Refusing to eat breakfast or take his morning medicines. Dr. Wynelle LinkKolluru also at bedside to assist. Patient refuses to co-operate.

## 2016-03-02 NOTE — Progress Notes (Signed)
Patient ID: Rodney Valencia, male   DOB: [birth year], 82 y.o.   MRN: [id number]  [employer] PROGRESS NOTE  Rodney Valencia JOA:[id number] DOB: 1923/08/17 DOA: 02/27/2016 PCP: No PCP Per Patient  HPI/Subjective: Earlier patient refuses to take his medications now his healthcare powers of attorney is here today and is willing to take his meds  Objective: Vitals:   03/02/16 0354 03/02/16 0922  BP: (!) 130/55 (!) 119/47  Pulse: 85 80  Resp: 19   Temp: 98 F (36.7 C)     Filed Weights   02/27/16 2143 02/27/16 2149 02/28/16 0314  Weight: 72.1 kg (159 lb) 74.2 kg (163 lb 9.6 oz) 71.1 kg (156 lb 11.2 oz)    ROS: Review of Systems  Unable to perform ROS: Dementia   Exam: Physical Exam  HENT:  Nose: No mucosal edema.  Mouth/Throat: No oropharyngeal exudate or posterior oropharyngeal edema.  Eyes: Conjunctivae, EOM and lids are normal. Pupils are equal, round, and reactive to light.  Neck: No JVD present. Carotid bruit is not present. No edema present. No thyroid mass and no thyromegaly present.  Cardiovascular: S1 normal and S2 normal.  Exam reveals no gallop.   No murmur heard. Pulses:      Dorsalis pedis pulses are 2+ on the right side, and 2+ on the left side.  Respiratory: No respiratory distress. He has decreased breath sounds in the right lower field and the left lower field. He has no rhonchi. He has no rales.  GI: Soft. Bowel sounds are normal. There is no tenderness.  Musculoskeletal:       Right ankle: He exhibits swelling.       Left ankle: He exhibits swelling.  Lymphadenopathy:    He has no cervical adenopathy.  Neurological: He is alert.  Skin: Skin is warm. No rash noted. Nails show no clubbing.  Psychiatric:  Patient mumbled with sternal rub.      Data Reviewed: Basic Metabolic Panel:  Recent Labs Lab 02/27/16 2154 02/28/16 0411 02/29/16 0542 03/02/16 0506  NA 138 141 142 143  K 4.8 4.1 4.2 4.3  CL 110 112* 111 110  CO2 23 22 26  21*  GLUCOSE 153*  69 50* 112*  BUN 52* 47* 48* 61*  CREATININE 3.61* 3.58* 3.45* 3.99*  CALCIUM 7.9* 7.8* 8.5* 8.4*  PHOS  --   --   --  5.1*   Liver Function Tests:  Recent Labs Lab 02/27/16 2154 03/02/16 0506  AST 20  --   ALT 15*  --   ALKPHOS 86  --   BILITOT 0.3  --   PROT 5.9*  --   ALBUMIN 3.5 3.0*   CBC:  Recent Labs Lab 02/27/16 2154 02/28/16 0411  WBC 9.9 10.2  NEUTROABS 7.3*  --   HGB 9.8* 9.8*  HCT 28.7* 28.7*  MCV 88.9 88.9  PLT 199 185    CBG:  Recent Labs Lab 03/01/16 2002 03/01/16 2351 03/02/16 0626 03/02/16 0815 03/02/16 1204  GLUCAP 110* 102* 111* 109* 133*    Recent Results (from the past 240 hour(s))  Blood Culture (routine x 2)     Status: None (Preliminary result)   Collection Time: 02/27/16  9:54 PM  Result Value Ref Range Status   Specimen Description BLOOD RIGHT ASSIST CONTROL  Final   Special Requests BOTTLES DRAWN AEROBIC AND ANAEROBIC 8CC  Final   Culture NO GROWTH 4 DAYS  Final   Report Status PENDING  Incomplete  Blood Culture (routine x 2)  Status: None (Preliminary result)   Collection Time: 02/27/16  9:54 PM  Result Value Ref Range Status   Specimen Description BLOOD LEFT FATTY CASTS  Final   Special Requests BOTTLES DRAWN AEROBIC AND ANAEROBIC 5CC  Final   Culture NO GROWTH 4 DAYS  Final   Report Status PENDING  Incomplete  Culture, expectorated sputum-assessment     Status: None   Collection Time: 02/27/16 11:29 PM  Result Value Ref Range Status   Specimen Description SPUTUM  Final   Special Requests NONE  Final   Sputum evaluation   Final    Sputum specimen not acceptable for testing.  Please recollect.   SHEY BREWER @1040  ON 02/28/16 BY [facility]    Report Status 02/28/2016 FINAL  Final  MRSA PCR Screening     Status: None   Collection Time: 02/28/16  3:35 AM  Result Value Ref Range Status   MRSA by PCR NEGATIVE NEGATIVE Final    Comment:        The GeneXpert MRSA Assay (FDA approved for NASAL specimens only), is one  component of a comprehensive MRSA colonization surveillance program. It is not intended to diagnose MRSA infection nor to guide or monitor treatment for MRSA infections.      Studies: Ct Head Wo Contrast  Result Date: 03/01/2016 CLINICAL DATA:  Confusion, hallucinations, altered mental status EXAM: CT HEAD WITHOUT CONTRAST TECHNIQUE: Contiguous axial images were obtained from the base of the skull through the vertex without intravenous contrast. COMPARISON:  11/26/2015 FINDINGS: Brain: No evidence of acute infarction, hemorrhage, extra-axial collection, ventriculomegaly, or mass effect. Generalized cerebral atrophy. Periventricular white matter low attenuation likely secondary to microangiopathy. Vascular: Cerebrovascular atherosclerotic calcifications are noted. Skull: Negative for fracture or focal lesion. Sinuses/Orbits: Visualized portions of the orbits are unremarkable. There is bilateral ethmoid sinus mucosal thickening. Other: None. IMPRESSION: 1. No acute intracranial pathology. 2. Chronic microvascular disease and cerebral atrophy. Electronically Signed   By: Elige Ko   On: 03/01/2016 19:59    Scheduled Meds: . amLODipine  10 mg Oral Daily  . aspirin EC  81 mg Oral Daily  . budesonide (PULMICORT) nebulizer solution  0.25 mg Nebulization BID  . docusate sodium  100 mg Oral BID  . heparin  5,000 Units Subcutaneous Q8H  . ipratropium-albuterol  3 mL Nebulization BID  . [START ON 03/03/2016] levofloxacin  250 mg Oral Q48H  . Melatonin  2.5 mg Oral QHS  . metoprolol succinate  25 mg Oral Daily  . pantoprazole  40 mg Oral Daily  . polyethylene glycol  17 g Oral Daily  . QUEtiapine  25 mg Oral QHS  . sodium bicarbonate  650 mg Oral BID  . tamsulosin  0.4 mg Oral Daily    Assessment/Plan:  1. Acute delirium on top of dementia. Haldol when necessary. Seroquel at night.Delirium is improving 2. Hypoglycemia with diabetes. Glipizide and Lantus on hold. Change Accu-Cheks every  before meals daily at bedtime blood sugar stable 3. Systemic inflammatory response syndrome. Likely lung source with wheezing. Continue Levaquin. So far blood cultures are negative. Urinalysis unremarkable.  4. Asthmatic bronchitis. Continue nebulizer treatments with budesonide. 5. Chronic kidney disease stage IV. Nephrology consultation appreciated patient not a candidate for dialysis evaluation based on his advanced age and dementia 6. Weakness. Healthcare power of attorney requesting possible nursing home 7. Hyperlipidemia unspecified stop atorvastatin 8. GERD on Protonix 9. BPH on Flomax 10. Case discussed with Mardene Sayer who is patient's healthcare power of attorney was at the  bedside  Code Status: Patient made a DO NOT RESUSCITATE    Code Status Orders        Start     Ordered   02/28/16 0307  Full code  Continuous     02/28/16 0306    Code Status History    Date Active Date Inactive Code Status Order ID Comments User Context   This patient has a current code status but no historical code status.     Disposition Plan: TBD  Time spent: 25 minutes  Trenika Hudson, [facility]  [employer]

## 2016-03-03 LAB — GLUCOSE, CAPILLARY
GLUCOSE-CAPILLARY: 168 mg/dL — AB (ref 65–99)
GLUCOSE-CAPILLARY: 109 mg/dL — AB (ref 65–99)
Glucose-Capillary: 130 mg/dL — ABNORMAL HIGH (ref 65–99)

## 2016-03-03 LAB — CULTURE, BLOOD (ROUTINE X 2)
CULTURE: NO GROWTH
Culture: NO GROWTH

## 2016-03-03 LAB — CBC
HCT: 27.8 % — ABNORMAL LOW (ref 40.0–52.0)
Hemoglobin: 9.5 g/dL — ABNORMAL LOW (ref 13.0–18.0)
MCH: 30.6 pg (ref 26.0–34.0)
MCHC: 34.2 g/dL (ref 32.0–36.0)
MCV: 89.4 fL (ref 80.0–100.0)
PLATELETS: 208 10*3/uL (ref 150–440)
RBC: 3.12 MIL/uL — ABNORMAL LOW (ref 4.40–5.90)
RDW: 14.3 % (ref 11.5–14.5)
WBC: 7.5 10*3/uL (ref 3.8–10.6)

## 2016-03-03 NOTE — Progress Notes (Signed)
Patient ID: Rodney Valencia, male   DOB: 1924/05/29, 82 y.o.   MRN: [id number]  [employer] PROGRESS NOTE  Rodney Canadaarl Blaker WUJ:[id number] DOB: 1924/05/29 DOA: 02/27/2016 PCP: No PCP Per Patient  HPI/Subjective: Patient doing much better more alert and has baseline dementia and unable to provide any review of systems  Objective: Vitals:   03/02/16 2019 03/03/16 0526  BP: (!) 147/57 (!) 135/59  Pulse: 71 64  Resp: 18 20  Temp: 98.3 F (36.8 C) 97.8 F (36.6 C)    Filed Weights   02/27/16 2143 02/27/16 2149 02/28/16 0314  Weight: 72.1 kg (159 lb) 74.2 kg (163 lb 9.6 oz) 71.1 kg (156 lb 11.2 oz)    ROS: Review of Systems  Unable to perform ROS: Dementia   Exam: Physical Exam  HENT:  Nose: No mucosal edema.  Mouth/Throat: No oropharyngeal exudate or posterior oropharyngeal edema.  Eyes: Conjunctivae, EOM and lids are normal. Pupils are equal, round, and reactive to light.  Neck: No JVD present. Carotid bruit is not present. No edema present. No thyroid mass and no thyromegaly present.  Cardiovascular: S1 normal and S2 normal.  Exam reveals no gallop.   No murmur heard. Pulses:      Dorsalis pedis pulses are 2+ on the right side, and 2+ on the left side.  Respiratory: No respiratory distress. He has decreased breath sounds in the right lower field and the left lower field. He has no rhonchi. He has no rales.  GI: Soft. Bowel sounds are normal. There is no tenderness.  Musculoskeletal:       Right ankle: He exhibits swelling.       Left ankle: He exhibits swelling.  Lymphadenopathy:    He has no cervical adenopathy.  Neurological: He is alert.  Skin: Skin is warm. No rash noted. Nails show no clubbing.  Psychiatric:  Patient mumbled with sternal rub.      Data Reviewed: Basic Metabolic Panel:  Recent Labs Lab 02/27/16 2154 02/28/16 0411 02/29/16 0542 03/02/16 0506  NA 138 141 142 143  K 4.8 4.1 4.2 4.3  CL 110 112* 111 110  CO2 23 22 26  21*  GLUCOSE 153* 69  50* 112*  BUN 52* 47* 48* 61*  CREATININE 3.61* 3.58* 3.45* 3.99*  CALCIUM 7.9* 7.8* 8.5* 8.4*  PHOS  --   --   --  5.1*   Liver Function Tests:  Recent Labs Lab 02/27/16 2154 03/02/16 0506  AST 20  --   ALT 15*  --   ALKPHOS 86  --   BILITOT 0.3  --   PROT 5.9*  --   ALBUMIN 3.5 3.0*   CBC:  Recent Labs Lab 02/27/16 2154 02/28/16 0411 03/03/16 0458  WBC 9.9 10.2 7.5  NEUTROABS 7.3*  --   --   HGB 9.8* 9.8* 9.5*  HCT 28.7* 28.7* 27.8*  MCV 88.9 88.9 89.4  PLT 199 185 208    CBG:  Recent Labs Lab 03/02/16 0815 03/02/16 1204 03/02/16 1635 03/02/16 2118 03/03/16 1139  GLUCAP 109* 133* 189* 121* 168*    Recent Results (from the past 240 hour(s))  Blood Culture (routine x 2)     Status: None   Collection Time: 02/27/16  9:54 PM  Result Value Ref Range Status   Specimen Description BLOOD RIGHT ASSIST CONTROL  Final   Special Requests BOTTLES DRAWN AEROBIC AND ANAEROBIC 8CC  Final   Culture NO GROWTH 5 DAYS  Final   Report Status 03/03/2016 FINAL  Final  Blood Culture (routine x 2)     Status: None   Collection Time: 02/27/16  9:54 PM  Result Value Ref Range Status   Specimen Description BLOOD LEFT FATTY CASTS  Final   Special Requests BOTTLES DRAWN AEROBIC AND ANAEROBIC 5CC  Final   Culture NO GROWTH 5 DAYS  Final   Report Status 03/03/2016 FINAL  Final  Culture, expectorated sputum-assessment     Status: None   Collection Time: 02/27/16 11:29 PM  Result Value Ref Range Status   Specimen Description SPUTUM  Final   Special Requests NONE  Final   Sputum evaluation   Final    Sputum specimen not acceptable for testing.  Please recollect.   SHEY BREWER @1040  ON 02/28/16 BY [facility]KP    Report Status 02/28/2016 FINAL  Final  MRSA PCR Screening     Status: None   Collection Time: 02/28/16  3:35 AM  Result Value Ref Range Status   MRSA by PCR NEGATIVE NEGATIVE Final    Comment:        The GeneXpert MRSA Assay (FDA approved for NASAL specimens only), is one  component of a comprehensive MRSA colonization surveillance program. It is not intended to diagnose MRSA infection nor to guide or monitor treatment for MRSA infections.      Studies: Ct Head Wo Contrast  Result Date: 03/01/2016 CLINICAL DATA:  Confusion, hallucinations, altered mental status EXAM: CT HEAD WITHOUT CONTRAST TECHNIQUE: Contiguous axial images were obtained from the base of the skull through the vertex without intravenous contrast. COMPARISON:  11/26/2015 FINDINGS: Brain: No evidence of acute infarction, hemorrhage, extra-axial collection, ventriculomegaly, or mass effect. Generalized cerebral atrophy. Periventricular white matter low attenuation likely secondary to microangiopathy. Vascular: Cerebrovascular atherosclerotic calcifications are noted. Skull: Negative for fracture or focal lesion. Sinuses/Orbits: Visualized portions of the orbits are unremarkable. There is bilateral ethmoid sinus mucosal thickening. Other: None. IMPRESSION: 1. No acute intracranial pathology. 2. Chronic microvascular disease and cerebral atrophy. Electronically Signed   By: Elige Ko   On: 03/01/2016 19:59    Scheduled Meds: . amLODipine  10 mg Oral Daily  . aspirin EC  81 mg Oral Daily  . budesonide (PULMICORT) nebulizer solution  0.25 mg Nebulization BID  . docusate sodium  100 mg Oral BID  . heparin  5,000 Units Subcutaneous Q8H  . ipratropium-albuterol  3 mL Nebulization BID  . levofloxacin  250 mg Oral Q48H  . Melatonin  2.5 mg Oral QHS  . metoprolol succinate  25 mg Oral Daily  . pantoprazole  40 mg Oral Daily  . polyethylene glycol  17 g Oral Daily  . QUEtiapine  25 mg Oral QHS  . sodium bicarbonate  650 mg Oral BID  . tamsulosin  0.4 mg Oral Daily    Assessment/Plan:  1. Acute delirium on top of dementia. Haldol when necessary. Seroquel at night.Delirium is improving 2. Hypoglycemia with diabetes. Glipizide and Lantus on hold. Change Accu-Cheks every before meals daily at  bedtime blood sugar stable 3. Systemic inflammatory response syndrome. Likely lung source with wheezing.Now resolved stop Levaquin So far blood cultures are negative. Urinalysis unremarkable.  4. Asthmatic bronchitis. Continue nebulizer treatments with budesonide. 5. Chronic kidney disease stage IV. Nephrology consultation appreciated patient not a candidate for dialysis evaluation based on his advanced age and dementia 6. Weakness. Healthcare power of attorney requesting possible nursing home was discussed with social worker tomorrow regarding discharge 7. Hyperlipidemia unspecified stop atorvastatin 8. GERD on Protonix 9. BPH on Flomax  Code Status: Patient made a DO NOT RESUSCITATE    Code Status Orders        Start     Ordered   02/28/16 0307  Full code  Continuous     02/28/16 0306    Code Status History    Date Active Date Inactive Code Status Order ID Comments User Context   This patient has a current code status but no historical code status.     Disposition Plan: TBD  Time spent: 25 minutes  Sargon Scouten, [facility]  [employer]

## 2016-03-03 NOTE — Progress Notes (Addendum)
Patient uncooperative first thing this morning. Went in a little later and patient felt better and took his morning meds.

## 2016-03-03 NOTE — Progress Notes (Signed)
Nurse tech advises nurse that patient refuses finger stick for blood sugar.

## 2016-03-03 NOTE — Progress Notes (Signed)
[facility]  ROUNDING NOTE   Subjective:   Refusing to speak to me. More or less cooperative with nursing staff.  No new creatinine today  Objective:  Vital signs in last 24 hours:  Temp:  [97.6 F (36.4 C)-98.3 F (36.8 C)] 97.8 F (36.6 C) (08/27 0526) Pulse Rate:  [64-76] 64 (08/27 0526) Resp:  [18-20] 20 (08/27 0526) BP: (118-147)/(55-59) 135/59 (08/27 0526) SpO2:  [97 %-100 %] 100 % (08/27 0526)  Weight change:  Filed Weights   02/27/16 2143 02/27/16 2149 02/28/16 0314  Weight: 72.1 kg (159 lb) 74.2 kg (163 lb 9.6 oz) 71.1 kg (156 lb 11.2 oz)    Intake/Output: I/O last 3 completed shifts: In: 120 [P.O.:120] Out: 650 [Urine:650]   Intake/Output this shift:  No intake/output data recorded.  Physical Exam: General: NAD, laying in bed  Head: +hard of hearing  Eyes: Anicteric, PERRL  Neck: Supple, trachea midline  Lungs:  Clear to auscultation  Heart: Regular rate and rhythm  Abdomen:  Soft, nontender,   Extremities: no peripheral edema.  Neurologic: Nonfocal, moving all four extremities, not alert or oriented  Skin: No lesions       Basic Metabolic Panel:  Recent Labs Lab 02/27/16 2154 02/28/16 0411 02/29/16 0542 03/02/16 0506  NA 138 141 142 143  K 4.8 4.1 4.2 4.3  CL 110 112* 111 110  CO2 23 22 26  21*  GLUCOSE 153* 69 50* 112*  BUN 52* 47* 48* 61*  CREATININE 3.61* 3.58* 3.45* 3.99*  CALCIUM 7.9* 7.8* 8.5* 8.4*  PHOS  --   --   --  5.1*    Liver Function Tests:  Recent Labs Lab 02/27/16 2154 03/02/16 0506  AST 20  --   ALT 15*  --   ALKPHOS 86  --   BILITOT 0.3  --   PROT 5.9*  --   ALBUMIN 3.5 3.0*   No results for input(s): LIPASE, AMYLASE in the last 168 hours. No results for input(s): AMMONIA in the last 168 hours.  CBC:  Recent Labs Lab 02/27/16 2154 02/28/16 0411 03/03/16 0458  WBC 9.9 10.2 7.5  NEUTROABS 7.3*  --   --   HGB 9.8* 9.8* 9.5*  HCT 28.7* 28.7* 27.8*  MCV 88.9 88.9 89.4  PLT 199 185 208     Cardiac Enzymes: No results for input(s): CKTOTAL, CKMB, CKMBINDEX, TROPONINI in the last 168 hours.  BNP: Invalid input(s): POCBNP  CBG:  Recent Labs Lab 03/02/16 0626 03/02/16 0815 03/02/16 1204 03/02/16 1635 03/02/16 2118  GLUCAP 111* 109* 133* 189* 121*    Microbiology: Results for orders placed or performed during the hospital encounter of 02/27/16  Blood Culture (routine x 2)     Status: None   Collection Time: 02/27/16  9:54 PM  Result Value Ref Range Status   Specimen Description BLOOD RIGHT ASSIST CONTROL  Final   Special Requests BOTTLES DRAWN AEROBIC AND ANAEROBIC 8CC  Final   Culture NO GROWTH 5 DAYS  Final   Report Status 03/03/2016 FINAL  Final  Blood Culture (routine x 2)     Status: None   Collection Time: 02/27/16  9:54 PM  Result Value Ref Range Status   Specimen Description BLOOD LEFT FATTY CASTS  Final   Special Requests BOTTLES DRAWN AEROBIC AND ANAEROBIC 5CC  Final   Culture NO GROWTH 5 DAYS  Final   Report Status 03/03/2016 FINAL  Final  Culture, expectorated sputum-assessment     Status: None  Collection Time: 02/27/16 11:29 PM  Result Value Ref Range Status   Specimen Description SPUTUM  Final   Special Requests NONE  Final   Sputum evaluation   Final    Sputum specimen not acceptable for testing.  Please recollect.   SHEY BREWER @1040  ON 02/28/16 BY HKP    Report Status 02/28/2016 FINAL  Final  MRSA PCR Screening     Status: None   Collection Time: 02/28/16  3:35 AM  Result Value Ref Range Status   MRSA by PCR NEGATIVE NEGATIVE Final    Comment:        The GeneXpert MRSA Assay (FDA approved for NASAL specimens only), is one component of a comprehensive MRSA colonization surveillance program. It is not intended to diagnose MRSA infection nor to guide or monitor treatment for MRSA infections.     Coagulation Studies: No results for input(s): LABPROT, INR in the last 72 hours.  Urinalysis: No results for input(s):  COLORURINE, LABSPEC, PHURINE, GLUCOSEU, HGBUR, BILIRUBINUR, KETONESUR, PROTEINUR, UROBILINOGEN, NITRITE, LEUKOCYTESUR in the last 72 hours.  Invalid input(s): APPERANCEUR    Imaging: Ct Head Wo Contrast  Result Date: 03/01/2016 CLINICAL DATA:  Confusion, hallucinations, altered mental status EXAM: CT HEAD WITHOUT CONTRAST TECHNIQUE: Contiguous axial images were obtained from the base of the skull through the vertex without intravenous contrast. COMPARISON:  11/26/2015 FINDINGS: Brain: No evidence of acute infarction, hemorrhage, extra-axial collection, ventriculomegaly, or mass effect. Generalized cerebral atrophy. Periventricular white matter low attenuation likely secondary to microangiopathy. Vascular: Cerebrovascular atherosclerotic calcifications are noted. Skull: Negative for fracture or focal lesion. Sinuses/Orbits: Visualized portions of the orbits are unremarkable. There is bilateral ethmoid sinus mucosal thickening. Other: None. IMPRESSION: 1. No acute intracranial pathology. 2. Chronic microvascular disease and cerebral atrophy. Electronically Signed   By: Elige Ko   On: 03/01/2016 19:59     Medications:     . amLODipine  10 mg Oral Daily  . aspirin EC  81 mg Oral Daily  . budesonide (PULMICORT) nebulizer solution  0.25 mg Nebulization BID  . docusate sodium  100 mg Oral BID  . heparin  5,000 Units Subcutaneous Q8H  . ipratropium-albuterol  3 mL Nebulization BID  . levofloxacin  250 mg Oral Q48H  . Melatonin  2.5 mg Oral QHS  . metoprolol succinate  25 mg Oral Daily  . pantoprazole  40 mg Oral Daily  . polyethylene glycol  17 g Oral Daily  . QUEtiapine  25 mg Oral QHS  . sodium bicarbonate  650 mg Oral BID  . tamsulosin  0.4 mg Oral Daily   acetaminophen **OR** acetaminophen, guaiFENesin-dextromethorphan, haloperidol lactate, ondansetron **OR** ondansetron (ZOFRAN) IV  Assessment/ Plan:  Mr. Rodney Valencia is a 82 y.o. white male with hypertension, diabetes mellitus  type II, BPH, hyperlipidemia, peripheral vascular disease, who was admitted to [facility] on 02/27/2016   1. Acute renal failure on chronic kidney disease stage IV with proteinuria and metabolic acidosis: acute renal failure versus progression of disease. With hypotension, fever and SIRS. Chronic kidney disease secondary to diabetic nephropathy. Not currently on an ACE-I/ARB as outpatient.  Renal ultrasound negative for obstruction. - Sodium bicarbonate - Not a candidate for dialysis.   2. SIRS: empirically on levofloxacin.   3. Hypertension: blood pressure at goal.  - restarted on amlodipine and metoprolol.   4. Diabetes mellitus type II with chronic kidney disease:  - Continue glucose control.    LOS: 4 Rodney Valencia 8/27/201710:50 AM

## 2016-03-04 LAB — GLUCOSE, CAPILLARY
GLUCOSE-CAPILLARY: 114 mg/dL — AB (ref 65–99)
Glucose-Capillary: 181 mg/dL — ABNORMAL HIGH (ref 65–99)

## 2016-03-04 MED ORDER — LEVOFLOXACIN 250 MG PO TABS: 250.0000 mg | ORAL_TABLET | ORAL | 0 refills | Status: AC

## 2016-03-04 NOTE — Discharge Summary (Signed)
Rodney Valencia, 82 y.o., DOB 30-Dec-1923, MRN [id number]. Admission date: 02/27/2016 Discharge Date 03/04/2016 Primary MD No PCP Per Patient Admitting Physician Ihor Austin, MD  Admission Diagnosis  CKD (chronic kidney disease) stage 4, GFR 15-29 ml/min (HCC) [N18.4] HCAP (healthcare-associated pneumonia) [J18.9]  Discharge Diagnosis   Principal Problem:   SIRS (systemic inflammatory response syndrome) (HCC)    Acute bronchitis with bronchospasm   acute encephalopathy Chronic kidney disease stage IV Acute delirium on chronic dementia Hyperlipidemia GERD BPH        Hospital Course Rodney Valencia  is a 82 y.o. male with a known history of Chronic kidney disease stage IV, diabetes mellitus type 2, hypertension, peripheral vascular disease presented to the emergency room with fever.  patient has had a dry cough. He was admitted for sepsis his chest x-ray did not show pneumonia. However his symptoms were felt to be due to an upper respiratory infection and bronchospasm. Patient was treated with antibiotics and nebulizers with significant improvement in his symptoms. Currently is asymptomatic. Patient also developed acute delirium and was treated with medications his mental status is now back to baseline. He does have chronic kidney disease stage IV and was seen by nephrology and not deemed a candidate for dialysis based on his advanced age. This was discussed with his healthcare power of attorney who agreed with the plan.            Consults  nephrology  Significant Tests:  See full reports for all details     Dg Chest 1 View  Result Date: 02/29/2016 CLINICAL DATA:  Fever. EXAM: CHEST 1 VIEW COMPARISON:  02/27/2016 FINDINGS: The cardiac silhouette is enlarged. Mediastinal contours appear intact. There is no evidence of focal airspace consolidation, pleural effusion or pneumothorax. Mildly increased prominence of the interstitial markings. Osseous structures are without acute  abnormality. Soft tissues are grossly normal. IMPRESSION: Mildly increased prominence of the interstitial markings which may be seen with hypoventilation or mild interstitial pulmonary edema. Enlarged cardiac silhouette, which may be partially accounted for by AP technique. Electronically Signed   By: Ted Mcalpine M.D.   On: 02/29/2016 07:08   Dg Chest 2 View  Result Date: 02/27/2016 CLINICAL DATA:  Fever, productive cough EXAM: CHEST  2 VIEW COMPARISON:  None. FINDINGS: Lungs are essentially clear. No focal consolidation. No pleural effusion or pneumothorax. The heart is normal in size. Degenerative changes of the visualized thoracolumbar spine. IMPRESSION: No evidence of acute cardiopulmonary disease. Electronically Signed   By: Charline Bills M.D.   On: 02/27/2016 22:14   Ct Head Wo Contrast  Result Date: 03/01/2016 CLINICAL DATA:  Confusion, hallucinations, altered mental status EXAM: CT HEAD WITHOUT CONTRAST TECHNIQUE: Contiguous axial images were obtained from the base of the skull through the vertex without intravenous contrast. COMPARISON:  11/26/2015 FINDINGS: Brain: No evidence of acute infarction, hemorrhage, extra-axial collection, ventriculomegaly, or mass effect. Generalized cerebral atrophy. Periventricular white matter low attenuation likely secondary to microangiopathy. Vascular: Cerebrovascular atherosclerotic calcifications are noted. Skull: Negative for fracture or focal lesion. Sinuses/Orbits: Visualized portions of the orbits are unremarkable. There is bilateral ethmoid sinus mucosal thickening. Other: None. IMPRESSION: 1. No acute intracranial pathology. 2. Chronic microvascular disease and cerebral atrophy. Electronically Signed   By: Elige Ko   On: 03/01/2016 19:59   US Renal  Result Date: 02/28/2016 CLINICAL DATA:  Acute on chronic renal failure. EXAM: RENAL / URINARY TRACT ULTRASOUND COMPLETE COMPARISON:  CT abdomen and pelvis 11/23/2015. FINDINGS: Right Kidney:  Length: 9.5 cm. Cortical  echogenicity is increased and the cortex is markedly thinned. 2 simple cysts are identified. The larger measures 1.6 cm. No hydronephrosis. Left Kidney: Length: 10.9 cm. Cortex is echogenic and markedly thinned. Two simple cysts are identified. The larger measures 2.8 cm. No hydronephrosis. Bladder: Appears normal for degree of bladder distention. IMPRESSION: Negative for hydronephrosis. Senescent change and findings consistent with medical renal disease. Electronically Signed   By: Drusilla Kannerhomas  Dalessio M.D.   On: 02/28/2016 18:03       Today   Subjective:   Rodney CanadaEarl Valencia  Pt doing very well awake and alert  Objective:   Blood pressure (!) 155/55, pulse (!) 58, temperature 98.2 F (36.8 C), temperature source Oral, resp. rate 18, height 5\' 5"  (1.651 m), weight 71.1 kg (156 lb 11.2 oz), SpO2 95 %.  .  Intake/Output Summary (Last 24 hours) at 03/04/16 1236 Last data filed at 03/04/16 1000  Gross per 24 hour  Intake              480 ml  Output              100 ml  Net              380 ml    Exam VITAL SIGNS: Blood pressure (!) 155/55, pulse (!) 58, temperature 98.2 F (36.8 C), temperature source Oral, resp. rate 18, height 5\' 5"  (1.651 m), weight 71.1 kg (156 lb 11.2 oz), SpO2 95 %.  GENERAL:  82 y.o.-year-old patient lying in the bed with no acute distress.  EYES: Pupils equal, round, reactive to light and accommodation. No scleral icterus. Extraocular muscles intact.  HEENT: Head atraumatic, normocephalic. Oropharynx and nasopharynx clear.  NECK:  Supple, no jugular venous distention. No thyroid enlargement, no tenderness.  LUNGS: Normal breath sounds bilaterally, no wheezing, rales,rhonchi or crepitation. No use of accessory muscles of respiration.  CARDIOVASCULAR: S1, S2 normal. No murmurs, rubs, or gallops.  ABDOMEN: Soft, nontender, nondistended. Bowel sounds present. No organomegaly or mass.  EXTREMITIES: No pedal edema, cyanosis, or clubbing.   NEUROLOGIC: Cranial nerves II through XII are intact. Muscle strength 5/5 in all extremities. Sensation intact. Gait not checked.  PSYCHIATRIC: The patient is alert and oriented x 3.  SKIN: No obvious rash, lesion, or ulcer.   Data Review     CBC w Diff: Lab Results  Component Value Date   WBC 7.5 03/03/2016   HGB 9.5 (L) 03/03/2016   HCT 27.8 (L) 03/03/2016   PLT 208 03/03/2016   LYMPHOPCT 12 02/27/2016   MONOPCT 10 02/27/2016   EOSPCT 3 02/27/2016   BASOPCT 1 02/27/2016   CMP: Lab Results  Component Value Date   NA 143 03/02/2016   K 4.3 03/02/2016   CL 110 03/02/2016   CO2 21 (L) 03/02/2016   BUN 61 (H) 03/02/2016   CREATININE 3.99 (H) 03/02/2016   PROT 5.9 (L) 02/27/2016   ALBUMIN 3.0 (L) 03/02/2016   BILITOT 0.3 02/27/2016   ALKPHOS 86 02/27/2016   AST 20 02/27/2016   ALT 15 (L) 02/27/2016  .  Micro Results Recent Results (from the past 240 hour(s))  Blood Culture (routine x 2)     Status: None   Collection Time: 02/27/16  9:54 PM  Result Value Ref Range Status   Specimen Description BLOOD RIGHT ASSIST CONTROL  Final   Special Requests BOTTLES DRAWN AEROBIC AND ANAEROBIC 8CC  Final   Culture NO GROWTH 5 DAYS  Final   Report Status 03/03/2016 FINAL  Final  Blood Culture (routine x 2)     Status: None   Collection Time: 02/27/16  9:54 PM  Result Value Ref Range Status   Specimen Description BLOOD LEFT FATTY CASTS  Final   Special Requests BOTTLES DRAWN AEROBIC AND ANAEROBIC 5CC  Final   Culture NO GROWTH 5 DAYS  Final   Report Status 03/03/2016 FINAL  Final  Culture, expectorated sputum-assessment     Status: None   Collection Time: 02/27/16 11:29 PM  Result Value Ref Range Status   Specimen Description SPUTUM  Final   Special Requests NONE  Final   Sputum evaluation   Final    Sputum specimen not acceptable for testing.  Please recollect.   SHEY BREWER @1040  ON 02/28/16 BY [facility]KP    Report Status 02/28/2016 FINAL  Final  MRSA PCR Screening     Status:  None   Collection Time: 02/28/16  3:35 AM  Result Value Ref Range Status   MRSA by PCR NEGATIVE NEGATIVE Final    Comment:        The GeneXpert MRSA Assay (FDA approved for NASAL specimens only), is one component of a comprehensive MRSA colonization surveillance program. It is not intended to diagnose MRSA infection nor to guide or monitor treatment for MRSA infections.         Code Status Orders        Start     Ordered   03/01/16 1119  Do not attempt resuscitation (DNR)  Continuous    Question Answer Comment  In the event of cardiac or respiratory ARREST Do not call a "code blue"   In the event of cardiac or respiratory ARREST Do not perform Intubation, CPR, defibrillation or ACLS   In the event of cardiac or respiratory ARREST Use medication by any route, position, wound care, and other measures to relive pain and suffering. May use oxygen, suction and manual treatment of airway obstruction as needed for comfort.   Comments nurse may pronounce      03/01/16 1119    Code Status History    Date Active Date Inactive Code Status Order ID Comments User Context   02/28/2016  3:06 AM 03/01/2016 11:19 AM Full Code [id number]  Ihor Austin, MD Inpatient          Follow-up Information    pcp Follow up in 7 day(s).           Discharge Medications     Medication List    STOP taking these medications   glipiZIDE 5 MG 24 hr tablet Commonly known as:  GLUCOTROL XL     TAKE these medications   acetaminophen 500 MG tablet Commonly known as:  TYLENOL Take 500 mg by mouth 3 (three) times daily.   amLODipine 10 MG tablet Commonly known as:  NORVASC Take 10 mg by mouth daily.   aspirin EC 81 MG tablet Take 81 mg by mouth daily.   atorvastatin 10 MG tablet Commonly known as:  LIPITOR Take 10 mg by mouth at bedtime.   cholecalciferol 1000 units tablet Commonly known as:  VITAMIN D Take 1,000 Units by mouth daily.   docusate sodium 100 MG capsule Commonly  known as:  COLACE Take 100 mg by mouth 2 (two) times daily.   gabapentin 100 MG capsule Commonly known as:  NEURONTIN Take 200 mg by mouth 2 (two) times daily.   insulin glargine 100 UNIT/ML injection Commonly known as:  LANTUS Inject 7 Units into the skin daily.   levofloxacin  250 MG tablet Commonly known as:  LEVAQUIN Take 1 tablet (250 mg total) by mouth every other day.   meclizine 25 MG tablet Commonly known as:  ANTIVERT Take 25 mg by mouth every 8 (eight) hours as needed for dizziness.   Melatonin 3 MG Tabs Take 3 mg by mouth at bedtime.   metoprolol succinate 25 MG 24 hr tablet Commonly known as:  TOPROL-XL Take 25 mg by mouth daily.   omeprazole 20 MG capsule Commonly known as:  PRILOSEC Take 20 mg by mouth 2 (two) times daily before a meal.   ondansetron 4 MG disintegrating tablet Commonly known as:  ZOFRAN ODT Take 1 tablet (4 mg total) by mouth every 8 (eight) hours as needed for nausea or vomiting.   polyethylene glycol packet Commonly known as:  MIRALAX / GLYCOLAX Take 17 g by mouth daily.   sodium bicarbonate 650 MG tablet Take 650 mg by mouth 2 (two) times daily.   tamsulosin 0.4 MG Caps capsule Commonly known as:  FLOMAX Take 0.4 mg by mouth daily.   tolterodine 2 MG tablet Commonly known as:  DETROL Take 2 mg by mouth 2 (two) times daily.   traMADol 50 MG tablet Commonly known as:  ULTRAM Take 50 mg by mouth every 6 (six) hours as needed.          Total Time in preparing paper work, data evaluation and todays exam - 35 minutes  Auburn Bilberry M.D on 03/04/2016 at 12:36 PM  [facility]   Office  [phone]

## 2016-03-04 NOTE — Progress Notes (Signed)
Clinical Social Worker was informed that patient will be medically ready to [facility] Patient in a agreement with plan. CSW called Hazel- Admissions Coordinator [facility] to confirm that patient's bed is ready. [facility] via [facility]. Rx's and DNR added to discharge packet.   Call to patient's son, inform him [facility] would [facility] their transportation at 3 PM.  Woodroe Modehristina Quiera Diffee, MSW, LCSW, LCAS-A Clinical Social Worker [phone]

## 2016-03-04 NOTE — Evaluation (Signed)
Physical Therapy Evaluation Patient Details Name: Rodney Valencia MRN: [id number] DOB: 10-10-1923 Today's Date: 03/04/2016   History of Present Illness  presented to ER from ALF secondary to fever; admitted with SIRS of unknown etiology.  Hospital course complicated by delirium and fluid overload; currently on RA, maintaining sats >92% at rest and with exertion.  Clinical Impression  Upon evaluation, patient alert and oriented to self only; follows simple commands, pleasant and cooperative throughout evaluation.  Demonstrates strength/ROM grossly symmetrical and WFL throughout all extremities; denies pain.  Maintains sats >92% on RA at rest and with exertion.  Able to complete bed mobility indep; sit/stand, basic transfers and gait (400') with RW, cga.  Mild staggering at times (with head turns), but able to self-correct without external assist from therapist.  Recommend continued use of RW at all times for external support. Would benefit from skilled PT to address above deficits and promote optimal return to PLOF; Recommend transition to HHPT upon discharge from acute hospitalization.     Follow Up Recommendations Home health PT    Equipment Recommendations       Recommendations for Other Services       Precautions / Restrictions Precautions Precautions: Fall Restrictions Weight Bearing Restrictions: No      Mobility  Bed Mobility Overal bed mobility: Modified Independent Bed Mobility: Supine to Sit     Supine to sit: Modified independent (Device/Increase time)        Transfers Overall transfer level: Needs assistance Equipment used: Rolling walker (2 wheeled) Transfers: Sit to/from Stand Sit to Stand: Min guard;Min assist            Ambulation/Gait Ambulation/Gait assistance: Min guard Ambulation Distance (Feet): 400 Feet Assistive device: Rolling walker (2 wheeled)   Gait velocity: 10' walk time, 8 seconds   General Gait Details: reciprocal stepping with fair  step height/length, mild L > R knee flexion throughout gait cycle. Occasionally staggering (with head turns), but able to self-correct without external assist from therapist.  Stairs            Wheelchair Mobility    Modified Rankin (Stroke Patients Only)       Balance Overall balance assessment: Needs assistance Sitting-balance support: No upper extremity supported;Feet supported Sitting balance-Leahy Scale: Good     Standing balance support: Bilateral upper extremity supported Standing balance-Leahy Scale: Fair                               Pertinent Vitals/Pain Pain Assessment: No/denies pain    Home Living Family/patient expects to be discharged to:: Assisted living Living Arrangements: Alone             Home Equipment: Walker - 4 wheels      Prior Function Level of Independence: Independent with assistive device(s)         Comments: Per patient report, does not receive assist for ADLs, household mobility of ambulation to/from dining hall (with [phone]); will verify with family as available.     Hand Dominance        Extremity/Trunk Assessment   Upper Extremity Assessment: Overall WFL for tasks assessed           Lower Extremity Assessment: Overall WFL for tasks assessed (grossly at least 4/5 throughout)         Communication   Communication: HOH  Cognition Arousal/Alertness: Awake/alert Behavior During Therapy: WFL for tasks assessed/performed Overall Cognitive Status:  (oriented to self; unaware of  location/date.  Follows simple commands and demonstrates fair insight/recall of information)                      General Comments      Exercises        Assessment/Plan    PT Assessment Patient needs continued PT services  PT Diagnosis Difficulty walking;Generalized weakness   PT Problem List Decreased activity tolerance;Decreased balance;Decreased mobility;Decreased cognition;Decreased safety awareness  PT  Treatment Interventions DME instruction;Gait training;Functional mobility training;Therapeutic activities;Therapeutic exercise;Balance training;Patient/family education   PT Goals (Current goals can be found in the Care Plan section) Acute Rehab PT Goals Patient Stated Goal: to get up to chair PT Goal Formulation: With patient Time For Goal Achievement: 03/18/16 Potential to Achieve Goals: Good    Frequency Min 2X/week   Barriers to discharge Decreased caregiver support      Co-evaluation               End of Session Equipment Utilized During Treatment: Gait belt Activity Tolerance: Patient tolerated treatment well Patient left: in chair;with call bell/phone within reach;with chair alarm set Nurse Communication: Mobility status         Time: [id number] PT Time Calculation (min) (ACUTE ONLY): 22 min   Charges:   PT Evaluation $PT Eval Low Complexity: 1 Procedure     PT G Codes:        Sabah Zucco H. Manson PasseyBrown, PT, DPT, NCS 03/04/16, 10:10 AM [phone]

## 2016-03-04 NOTE — Care Management Important Message (Signed)
Important Message  Patient Details  Name: Rodney Valencia MRN: [id number] Date of Birth: [birth year]   Medicare Important Message Given:  Yes    Gwenette GreetBrenda S Angi Goodell, RN 03/04/2016, 10:47 AM

## 2016-03-04 NOTE — Progress Notes (Signed)
CSW spoke to [place] [facility]. Informed RNCM.  Woodroe Modehristina Thecla Forgione, MSW, LCSW, LCAS-A Clinical Social Worker [phone]

## 2016-03-04 NOTE — Discharge Instructions (Signed)
°  DIET:  °Cardiac diet ° °DISCHARGE CONDITION:  °Stable ° °ACTIVITY:  °Activity as tolerated ° °OXYGEN:  °Home Oxygen: No. °  °Oxygen Delivery: room air ° °DISCHARGE LOCATION:  °assited living  ° ° °ADDITIONAL DISCHARGE INSTRUCTION: ° ° °If you experience worsening of your admission symptoms, develop shortness of breath, life threatening emergency, suicidal or homicidal thoughts you must seek medical attention immediately by calling 911 or calling your MD immediately  if symptoms less severe. ° °You Must read complete instructions/literature along with all the possible adverse reactions/side effects for all the Medicines you take and that have been prescribed to you. Take any new Medicines after you have completely understood and accpet all the possible adverse reactions/side effects.  ° °Please note ° °You were cared for by a hospitalist during your hospital stay. If you have any questions about your discharge medications or the care you received while you were in the hospital after you are discharged, you can call the unit and asked to speak with the hospitalist on call if the hospitalist that took care of you is not available. Once you are discharged, your primary care physician will handle any further medical issues. Please note that NO REFILLS for any discharge medications will be authorized once you are discharged, as it is imperative that you return to your primary care physician (or establish a relationship with a primary care physician if you do not have one) for your aftercare needs so that they can reassess your need for medications and monitor your lab values. ° ° °

## 2016-03-04 NOTE — Progress Notes (Signed)
[facility]  ROUNDING NOTE   Subjective:   Patient in good mood today No leg edema, no shortness of breath States he had a good breakfast No new labs today. Last creatinine was on 8/26 at 3.99  Objective:  Vital signs in last 24 hours:  Temp:  [97.7 F (36.5 C)-98.2 F (36.8 C)] 98.2 F (36.8 C) (08/28 0519) Pulse Rate:  [58-71] 58 (08/28 0519) Resp:  [18] 18 (08/27 1951) BP: (135-155)/(55-60) 155/55 (08/28 0519) SpO2:  [94 %-97 %] 95 % (08/28 1004)  Weight change:  Filed Weights   02/27/16 2143 02/27/16 2149 02/28/16 0314  Weight: 72.1 kg (159 lb) 74.2 kg (163 lb 9.6 oz) 71.1 kg (156 lb 11.2 oz)    Intake/Output: I/O last 3 completed shifts: In: 360 [P.O.:360] Out: -    Intake/Output this shift:  Total I/O In: 240 [P.O.:240] Out: 100 [Urine:100]  Physical Exam: General: NAD, laying in bed  Head: +hard of hearing  Eyes: Anicteric,    Neck: Supple, trachea midline  Lungs:  Clear to auscultation  Heart: Regular rate and rhythm  Abdomen:  Soft, nontender,   Extremities: no peripheral edema.  Neurologic: Nonfocal, moving all four extremities, not alert or oriented  Skin: No lesions       Basic Metabolic Panel:  Recent Labs Lab 02/27/16 2154 02/28/16 0411 02/29/16 0542 03/02/16 0506  NA 138 141 142 143  K 4.8 4.1 4.2 4.3  CL 110 112* 111 110  CO2 23 22 26  21*  GLUCOSE 153* 69 50* 112*  BUN 52* 47* 48* 61*  CREATININE 3.61* 3.58* 3.45* 3.99*  CALCIUM 7.9* 7.8* 8.5* 8.4*  PHOS  --   --   --  5.1*    Liver Function Tests:  Recent Labs Lab 02/27/16 2154 03/02/16 0506  AST 20  --   ALT 15*  --   ALKPHOS 86  --   BILITOT 0.3  --   PROT 5.9*  --   ALBUMIN 3.5 3.0*   No results for input(s): LIPASE, AMYLASE in the last 168 hours. No results for input(s): AMMONIA in the last 168 hours.  CBC:  Recent Labs Lab 02/27/16 2154 02/28/16 0411 03/03/16 0458  WBC 9.9 10.2 7.5  NEUTROABS 7.3*  --   --   HGB 9.8* 9.8* 9.5*  HCT  28.7* 28.7* 27.8*  MCV 88.9 88.9 89.4  PLT 199 185 208    Cardiac Enzymes: No results for input(s): CKTOTAL, CKMB, CKMBINDEX, TROPONINI in the last 168 hours.  BNP: Invalid input(s): POCBNP  CBG:  Recent Labs Lab 03/02/16 2118 03/03/16 1139 03/03/16 1628 03/03/16 2147 03/04/16 0747  GLUCAP 121* 168* 109* 130* 114*    Microbiology: Results for orders placed or performed during the hospital encounter of 02/27/16  Blood Culture (routine x 2)     Status: None   Collection Time: 02/27/16  9:54 PM  Result Value Ref Range Status   Specimen Description BLOOD RIGHT ASSIST CONTROL  Final   Special Requests BOTTLES DRAWN AEROBIC AND ANAEROBIC 8CC  Final   Culture NO GROWTH 5 DAYS  Final   Report Status 03/03/2016 FINAL  Final  Blood Culture (routine x 2)     Status: None   Collection Time: 02/27/16  9:54 PM  Result Value Ref Range Status   Specimen Description BLOOD LEFT FATTY CASTS  Final   Special Requests BOTTLES DRAWN AEROBIC AND ANAEROBIC 5CC  Final   Culture NO GROWTH 5 DAYS  Final   Report  Status 03/03/2016 FINAL  Final  Culture, expectorated sputum-assessment     Status: None   Collection Time: 02/27/16 11:29 PM  Result Value Ref Range Status   Specimen Description SPUTUM  Final   Special Requests NONE  Final   Sputum evaluation   Final    Sputum specimen not acceptable for testing.  Please recollect.   SHEY BREWER @1040  ON 02/28/16 BY [facility]    Report Status 02/28/2016 FINAL  Final  MRSA PCR Screening     Status: None   Collection Time: 02/28/16  3:35 AM  Result Value Ref Range Status   MRSA by PCR NEGATIVE NEGATIVE Final    Comment:        The GeneXpert MRSA Assay (FDA approved for NASAL specimens only), is one component of a comprehensive MRSA colonization surveillance program. It is not intended to diagnose MRSA infection nor to guide or monitor treatment for MRSA infections.     Coagulation Studies: No results for input(s): LABPROT, INR in the last 72  hours.  Urinalysis: No results for input(s): COLORURINE, LABSPEC, PHURINE, GLUCOSEU, HGBUR, BILIRUBINUR, KETONESUR, PROTEINUR, UROBILINOGEN, NITRITE, LEUKOCYTESUR in the last 72 hours.  Invalid input(s): APPERANCEUR    Imaging: No results found.   Medications:     . amLODipine  10 mg Oral Daily  . aspirin EC  81 mg Oral Daily  . budesonide (PULMICORT) nebulizer solution  0.25 mg Nebulization BID  . docusate sodium  100 mg Oral BID  . heparin  5,000 Units Subcutaneous Q8H  . ipratropium-albuterol  3 mL Nebulization BID  . levofloxacin  250 mg Oral Q48H  . Melatonin  2.5 mg Oral QHS  . metoprolol succinate  25 mg Oral Daily  . pantoprazole  40 mg Oral Daily  . polyethylene glycol  17 g Oral Daily  . QUEtiapine  25 mg Oral QHS  . sodium bicarbonate  650 mg Oral BID  . tamsulosin  0.4 mg Oral Daily   acetaminophen **OR** acetaminophen, guaiFENesin-dextromethorphan, haloperidol lactate, ondansetron **OR** ondansetron (ZOFRAN) IV  Assessment/ Plan:  Mr. Rodney Valencia is a 82 y.o. white male with hypertension, diabetes mellitus type II, BPH, hyperlipidemia, peripheral vascular disease, who was admitted to [facility] on 02/27/2016   1. Acute renal failure on chronic kidney disease stage IV with proteinuria and metabolic acidosis:  acute renal failure versus progression of disease. With hypotension, fever and SIRS. Chronic kidney disease secondary to diabetic nephropathy. Not currently on an ACE-I/ARB as outpatient.  Renal ultrasound negative for obstruction. - Sodium bicarbonate - Not a candidate for dialysis.   2. SIRS: empirically on levofloxacin.   3. Hypertension: blood pressure at goal.  - restarted on amlodipine and metoprolol.   4. Diabetes mellitus type II with chronic kidney disease:  - Continue glucose control.    LOS: 5 Rodney Valencia 8/28/201711:04 AM

## 2016-03-04 NOTE — Progress Notes (Signed)
[facility]  [facility].

## 2016-03-04 NOTE — Care Management (Signed)
[facility] per [employer] Sunkins Clinical Social Worker. Discussed that Rodney NorlanderGentiva [facility], Home Health, physical therapy, face-to-face, face sheet and discharge summary faxed to [facility].  [facility] per Dr. Ouida SillsPatel [name] S [name] RN MSN CCM Care management [phone]

## 2016-04-25 ENCOUNTER — Encounter: Payer: Self-pay | Admitting: Emergency Medicine

## 2016-04-25 ENCOUNTER — Emergency Department: Payer: Medicare Other

## 2016-04-25 ENCOUNTER — Emergency Department
Admission: EM | Admit: 2016-04-25 | Discharge: 2016-04-25 | Disposition: A | Discharge status: Other Acute Inpatient Hospital | Payer: Medicare Other | Attending: Emergency Medicine | Admitting: Emergency Medicine

## 2016-04-25 DIAGNOSIS — Z7982 Long term (current) use of aspirin: Secondary | ICD-10-CM | POA: Diagnosis not present

## 2016-04-25 DIAGNOSIS — N184 Chronic kidney disease, stage 4 (severe): Secondary | ICD-10-CM | POA: Diagnosis not present

## 2016-04-25 DIAGNOSIS — W19XXXA Unspecified fall, initial encounter: Secondary | ICD-10-CM | POA: Diagnosis not present

## 2016-04-25 DIAGNOSIS — Y929 Unspecified place or not applicable: Secondary | ICD-10-CM | POA: Insufficient documentation

## 2016-04-25 DIAGNOSIS — S32471A Displaced fracture of medial wall of right acetabulum, initial encounter for closed fracture: Secondary | ICD-10-CM | POA: Insufficient documentation

## 2016-04-25 DIAGNOSIS — Z791 Long term (current) use of non-steroidal anti-inflammatories (NSAID): Secondary | ICD-10-CM | POA: Diagnosis not present

## 2016-04-25 DIAGNOSIS — E1122 Type 2 diabetes mellitus with diabetic chronic kidney disease: Secondary | ICD-10-CM | POA: Insufficient documentation

## 2016-04-25 DIAGNOSIS — Y999 Unspecified external cause status: Secondary | ICD-10-CM | POA: Insufficient documentation

## 2016-04-25 DIAGNOSIS — S32401A Unspecified fracture of right acetabulum, initial encounter for closed fracture: Secondary | ICD-10-CM

## 2016-04-25 DIAGNOSIS — I129 Hypertensive chronic kidney disease with stage 1 through stage 4 chronic kidney disease, or unspecified chronic kidney disease: Secondary | ICD-10-CM | POA: Insufficient documentation

## 2016-04-25 DIAGNOSIS — Z79899 Other long term (current) drug therapy: Secondary | ICD-10-CM | POA: Insufficient documentation

## 2016-04-25 DIAGNOSIS — R778 Other specified abnormalities of plasma proteins: Secondary | ICD-10-CM

## 2016-04-25 DIAGNOSIS — Y9389 Activity, other specified: Secondary | ICD-10-CM | POA: Diagnosis not present

## 2016-04-25 DIAGNOSIS — N289 Disorder of kidney and ureter, unspecified: Secondary | ICD-10-CM

## 2016-04-25 DIAGNOSIS — G9389 Other specified disorders of brain: Secondary | ICD-10-CM | POA: Diagnosis not present

## 2016-04-25 DIAGNOSIS — F17200 Nicotine dependence, unspecified, uncomplicated: Secondary | ICD-10-CM | POA: Diagnosis not present

## 2016-04-25 DIAGNOSIS — Z794 Long term (current) use of insulin: Secondary | ICD-10-CM | POA: Diagnosis not present

## 2016-04-25 DIAGNOSIS — D649 Anemia, unspecified: Secondary | ICD-10-CM | POA: Diagnosis not present

## 2016-04-25 DIAGNOSIS — S32501A Unspecified fracture of right pubis, initial encounter for closed fracture: Secondary | ICD-10-CM | POA: Insufficient documentation

## 2016-04-25 DIAGNOSIS — R7989 Other specified abnormal findings of blood chemistry: Secondary | ICD-10-CM | POA: Insufficient documentation

## 2016-04-25 DIAGNOSIS — S79911A Unspecified injury of right hip, initial encounter: Secondary | ICD-10-CM | POA: Diagnosis present

## 2016-04-25 LAB — COMPREHENSIVE METABOLIC PANEL
ALK PHOS: 73 U/L (ref 38–126)
ALT: 14 U/L — AB (ref 17–63)
AST: 17 U/L (ref 15–41)
Albumin: 3.1 g/dL — ABNORMAL LOW (ref 3.5–5.0)
Anion gap: 7 (ref 5–15)
BUN: 51 mg/dL — AB (ref 6–20)
CALCIUM: 8.3 mg/dL — AB (ref 8.9–10.3)
CHLORIDE: 109 mmol/L (ref 101–111)
CO2: 24 mmol/L (ref 22–32)
CREATININE: 3.88 mg/dL — AB (ref 0.61–1.24)
GFR calc Af Amer: 14 mL/min — ABNORMAL LOW (ref >60)
GFR, EST NON AFRICAN AMERICAN: 12 mL/min — AB (ref >60)
Glucose, Bld: 136 mg/dL — ABNORMAL HIGH (ref 65–99)
Potassium: 4.7 mmol/L (ref 3.5–5.1)
Sodium: 140 mmol/L (ref 135–145)
Total Bilirubin: 0.5 mg/dL (ref 0.3–1.2)
Total Protein: 6.5 g/dL (ref 6.5–8.1)

## 2016-04-25 LAB — CBC
HEMATOCRIT: 28.7 % — AB (ref 40.0–52.0)
HEMOGLOBIN: 9.5 g/dL — AB (ref 13.0–18.0)
MCH: 29.6 pg (ref 26.0–34.0)
MCHC: 32.9 g/dL (ref 32.0–36.0)
MCV: 89.9 fL (ref 80.0–100.0)
PLATELETS: 274 10*3/uL (ref 150–440)
RBC: 3.19 MIL/uL — AB (ref 4.40–5.90)
RDW: 13.6 % (ref 11.5–14.5)
WBC: 11.8 10*3/uL — AB (ref 3.8–10.6)

## 2016-04-25 LAB — TROPONIN I: TROPONIN I: 0.03 ng/mL — AB (ref <0.03)

## 2016-04-25 MED ORDER — MORPHINE SULFATE (PF) 2 MG/ML IV SOLN
INTRAVENOUS | Status: AC
  Administered 2016-04-25: 2 mg via INTRAVENOUS
  Filled 2016-04-25: qty 1

## 2016-04-25 MED ORDER — MORPHINE SULFATE (PF) 2 MG/ML IV SOLN
2.0000 mg | Freq: Once | INTRAVENOUS | Status: AC
  Administered 2016-04-25: 2 mg via INTRAVENOUS

## 2016-04-25 NOTE — ED Notes (Signed)
Pt went to CT

## 2016-04-25 NOTE — ED Notes (Signed)
Pt returned from CT and X-Ray.  

## 2016-04-25 NOTE — ED Triage Notes (Signed)
Pt [facility] to [facility] ED [facility] for right hip pain secondary to a fall. Pt states that he had a mechanical fall while while going to the bathroom. Pt seems to be confused with no reported history of dementia.

## 2016-04-25 NOTE — ED Provider Notes (Signed)
[facility] Emergency Department Provider Note   ____________________________________________   First MD Initiated Contact with Patient 04/25/16 [phone]     (approximate)  I have reviewed the triage vital signs and the nursing notes.   HISTORY  Chief Complaint Fall  Limited by dementia  HPI Rodney Valencia is a 82 y.o. male brought [facility] the ED [facility] with a chief complaint of unwitnessed fall. Patient reports mechanical fall while going to the bathroom. Unsure if he had LOC. Complains of right hip pain. Denies associated neck pain, vision changes, chest pain, shortness of breath, abdominal pain, nausea, vomiting, diarrhea. Denies recent travel. Nothing makes his pain better. Movement makes his pain worse.   Past Medical History:  Diagnosis Date  . CKD (chronic kidney disease) stage 4, GFR 15-29 ml/min (HCC)   . Diabetes mellitus without complication (HCC)   . Hypertension   . PVD (peripheral vascular disease) (HCC)   . Renal disorder     Patient Active Problem List   Diagnosis Date Noted  . SIRS (systemic inflammatory response syndrome) (HCC) 02/28/2016    Past Surgical History:  Procedure Laterality Date  . none      Prior to Admission medications   Medication Sig Start Date End Date Taking? Authorizing Provider  acetaminophen (TYLENOL) 500 MG tablet Take 500 mg by mouth 3 (three) times daily.    Historical Provider, MD  amLODipine (NORVASC) 10 MG tablet Take 10 mg by mouth daily.    Historical Provider, MD  aspirin EC 81 MG tablet Take 81 mg by mouth daily.    Historical Provider, MD  atorvastatin (LIPITOR) 10 MG tablet Take 10 mg by mouth at bedtime.    Historical Provider, MD  cholecalciferol (VITAMIN D) 1000 units tablet Take 1,000 Units by mouth daily.    Historical Provider, MD  docusate sodium (COLACE) 100 MG capsule Take 100 mg by mouth 2 (two) times daily.    Historical Provider, MD  gabapentin (NEURONTIN) 100 MG  capsule Take 200 mg by mouth 2 (two) times daily.    Historical Provider, MD  insulin glargine (LANTUS) 100 UNIT/ML injection Inject 7 Units into the skin daily.     Historical Provider, MD  meclizine (ANTIVERT) 25 MG tablet Take 25 mg by mouth every 8 (eight) hours as needed for dizziness.    Historical Provider, MD  Melatonin 3 MG TABS Take 3 mg by mouth at bedtime.    Historical Provider, MD  metoprolol succinate (TOPROL-XL) 25 MG 24 hr tablet Take 25 mg by mouth daily.    Historical Provider, MD  omeprazole (PRILOSEC) 20 MG capsule Take 20 mg by mouth 2 (two) times daily before a meal.    Historical Provider, MD  ondansetron (ZOFRAN ODT) 4 MG disintegrating tablet Take 1 tablet (4 mg total) by mouth every 8 (eight) hours as needed for nausea or vomiting. 11/23/15   Gayla Doss, MD  polyethylene glycol (MIRALAX / GLYCOLAX) packet Take 17 g by mouth daily.    Historical Provider, MD  sodium bicarbonate 650 MG tablet Take 650 mg by mouth 2 (two) times daily.    Historical Provider, MD  tamsulosin (FLOMAX) 0.4 MG CAPS capsule Take 0.4 mg by mouth daily.    Historical Provider, MD  tolterodine (DETROL) 2 MG tablet Take 2 mg by mouth 2 (two) times daily.    Historical Provider, MD  traMADol (ULTRAM) 50 MG tablet Take 50 mg by mouth every 6 (six) hours as needed.  Historical Provider, MD    Allergies Review of patient's allergies indicates no known allergies.  Family History  Problem Relation Age of Onset  . Diabetes Mellitus II Mother     Social History Social History  Substance Use Topics  . Smoking status: Never Smoker  . Smokeless tobacco: Current User  . Alcohol use No    Review of Systems  Constitutional: No fever/chills. Eyes: No visual changes. ENT: No sore throat. Cardiovascular: Denies chest pain. Respiratory: Denies shortness of breath. Gastrointestinal: No abdominal pain.  No nausea, no vomiting.  No diarrhea.  No constipation. Genitourinary: Negative for  dysuria. Musculoskeletal: Positive for right hip pain. Negative for back pain. Skin: Negative for rash. Neurological: Negative for headaches, focal weakness or numbness.  10-point ROS otherwise negative.  ____________________________________________   PHYSICAL EXAM:  VITAL SIGNS: ED Triage Vitals  Enc Vitals Group     BP 04/25/16 0427 (!) 146/79     Pulse Rate 04/25/16 0427 68     Resp 04/25/16 0427 16     Temp 04/25/16 0427 98.2 F (36.8 C)     Temp Source 04/25/16 0427 Oral     SpO2 04/25/16 0427 97 %     Weight 04/25/16 0428 165 lb (74.8 kg)     Height 04/25/16 0428 6\' 1"  (1.854 m)     Head Circumference --      Peak Flow --      Pain Score 04/25/16 0431 8     Pain Loc --      Pain Edu? --      Excl. in GC? --     Constitutional: Alert and oriented. Well appearing and in no acute distress. Eyes: Conjunctivae are normal. PERRL. EOMI. Head: Atraumatic. Nose: No congestion/rhinnorhea. Mouth/Throat: Mucous membranes are moist.  Oropharynx non-erythematous. Neck: No stridor.  No cervical spine tenderness to palpation. Cardiovascular: Normal rate, regular rhythm. Grossly normal heart sounds.  Good peripheral circulation. Respiratory: Normal respiratory effort.  No retractions. Lungs CTAB. Gastrointestinal: Soft and nontender. No distention. No abdominal bruits. No CVA tenderness. Musculoskeletal:  Right hip: There is no shortening or rotation. Right hip tenderness to palpation with limited range of motion secondary to pain. Pelvis stable. Neurologic:  Normal speech and language. No gross focal neurologic deficits are appreciated. No gait instability. Skin:  Skin is warm, dry and intact. No rash noted. Psychiatric: Mood and affect are normal. Speech and behavior are normal.  ____________________________________________   LABS (all labs ordered are listed, but only abnormal results are displayed)  Labs Reviewed  CBC - Abnormal; Notable for the following:       Result  Value   WBC 11.8 (*)    RBC 3.19 (*)    Hemoglobin 9.5 (*)    HCT 28.7 (*)    All other components within normal limits  COMPREHENSIVE METABOLIC PANEL - Abnormal; Notable for the following:    Glucose, Bld 136 (*)    BUN 51 (*)    Creatinine, Ser 3.88 (*)    Calcium 8.3 (*)    Albumin 3.1 (*)    ALT 14 (*)    GFR calc non Af Amer 12 (*)    GFR calc Af Amer 14 (*)    All other components within normal limits  TROPONIN I - Abnormal; Notable for the following:    Troponin I 0.03 (*)    All other components within normal limits   ____________________________________________  EKG  ED ECG REPORT I, Brown Dunlap J, the attending physician,  personally viewed and interpreted this ECG.   Date: 04/25/2016  EKG Time: 0433  Rate: 67  Rhythm: normal EKG, normal sinus rhythm  Axis: Normal  Intervals:none  ST&T Change: Nonspecific  ____________________________________________  RADIOLOGY  Right hip x-rays (viewed by me, interpreted per Dr. Gwenyth Benderadparvar): Comminuted and displaced fracture of the medial wall of the right  acetabulum and right pubic bone. No definite femoral fracture. No  dislocation. CT may provide better evaluation.   CT head interpreted per Dr. Gwenyth Benderadparvar: No acute intracranial hemorrhage.    Age-related atrophy and chronic microvascular ischemic disease.   ____________________________________________   PROCEDURES  Procedure(s) performed: None  Procedures  Critical Care performed: No  ____________________________________________   INITIAL IMPRESSION / ASSESSMENT AND PLAN / ED COURSE  Pertinent labs & imaging results that were available during my care of the patient were reviewed by me and considered in my medical decision making (see chart for details).  82 year old male who presents status post a witnessed fall with right hip pain. Will obtain x-ray imaging studies of right hip, CT head given unwitnessed fall, and reassess.  Clinical Course  Comment  By Time  Discuss with orthopedics and called Dr. Martha ClanKrasinski who reviewed patient's x-rays and recommends transfer to tertiary care center for acetabular repair. I notified patient's medical POA Mr. Jolaine ClickHarrison Dainels [phone]. Patient is a DO NOT RESUSCITATE and has a number of medical issues; we discussed that patient will be medically evaluated to see if he would even be a surgical candidate. POA verbalizes understanding of this and agrees to proceed with transfer for evaluation of acetabular fracture. Irean HongJade J Finnis Colee, MD 10/19 [phone]  Patient [facility] by [facility]). Patient and POA updated. Irean HongJade J Lailee Hoelzel, MD 10/19 [phone]     ____________________________________________   FINAL CLINICAL IMPRESSION(S) / ED DIAGNOSES  Final diagnoses:  Fall  Closed displaced fracture of right acetabulum, unspecified portion of acetabulum, initial encounter (HCC)  Renal insufficiency  Anemia, unspecified type  Elevated troponin      NEW MEDICATIONS STARTED DURING THIS VISIT:  New Prescriptions   No medications on file     Note:  This document was prepared using Dragon voice recognition software and may include unintentional dictation errors.    Irean HongJade J Christle Nolting, MD 04/25/16 [phone]

## 2016-05-09 ENCOUNTER — Emergency Department: Payer: Medicare Other

## 2016-05-09 ENCOUNTER — Emergency Department
Admission: EM | Admit: 2016-05-09 | Discharge: 2016-05-09 | Disposition: A | Discharge status: Home / Self Care | Payer: Medicare Other | Attending: Emergency Medicine | Admitting: Emergency Medicine

## 2016-05-09 DIAGNOSIS — Z791 Long term (current) use of non-steroidal anti-inflammatories (NSAID): Secondary | ICD-10-CM | POA: Insufficient documentation

## 2016-05-09 DIAGNOSIS — Z794 Long term (current) use of insulin: Secondary | ICD-10-CM | POA: Diagnosis not present

## 2016-05-09 DIAGNOSIS — R319 Hematuria, unspecified: Secondary | ICD-10-CM

## 2016-05-09 DIAGNOSIS — N184 Chronic kidney disease, stage 4 (severe): Secondary | ICD-10-CM | POA: Insufficient documentation

## 2016-05-09 DIAGNOSIS — Z79899 Other long term (current) drug therapy: Secondary | ICD-10-CM | POA: Insufficient documentation

## 2016-05-09 DIAGNOSIS — E1122 Type 2 diabetes mellitus with diabetic chronic kidney disease: Secondary | ICD-10-CM | POA: Insufficient documentation

## 2016-05-09 DIAGNOSIS — I3139 Other pericardial effusion (noninflammatory): Secondary | ICD-10-CM

## 2016-05-09 DIAGNOSIS — F17200 Nicotine dependence, unspecified, uncomplicated: Secondary | ICD-10-CM | POA: Diagnosis not present

## 2016-05-09 DIAGNOSIS — R109 Unspecified abdominal pain: Secondary | ICD-10-CM | POA: Diagnosis present

## 2016-05-09 DIAGNOSIS — Z7951 Long term (current) use of inhaled steroids: Secondary | ICD-10-CM | POA: Diagnosis not present

## 2016-05-09 DIAGNOSIS — R1110 Vomiting, unspecified: Secondary | ICD-10-CM

## 2016-05-09 DIAGNOSIS — I313 Pericardial effusion (noninflammatory): Secondary | ICD-10-CM | POA: Insufficient documentation

## 2016-05-09 DIAGNOSIS — R112 Nausea with vomiting, unspecified: Secondary | ICD-10-CM | POA: Diagnosis not present

## 2016-05-09 DIAGNOSIS — Z7982 Long term (current) use of aspirin: Secondary | ICD-10-CM | POA: Insufficient documentation

## 2016-05-09 DIAGNOSIS — K8020 Calculus of gallbladder without cholecystitis without obstruction: Secondary | ICD-10-CM | POA: Diagnosis not present

## 2016-05-09 DIAGNOSIS — I129 Hypertensive chronic kidney disease with stage 1 through stage 4 chronic kidney disease, or unspecified chronic kidney disease: Secondary | ICD-10-CM | POA: Diagnosis not present

## 2016-05-09 LAB — URINALYSIS COMPLETE WITH MICROSCOPIC (ARMC ONLY)
Bacteria, UA: NONE SEEN
Bilirubin Urine: NEGATIVE
GLUCOSE, UA: 50 mg/dL — AB
Ketones, ur: NEGATIVE mg/dL
Nitrite: NEGATIVE
Protein, ur: 100 mg/dL — AB
SPECIFIC GRAVITY, URINE: 1.015 (ref 1.005–1.030)
pH: 5 (ref 5.0–8.0)

## 2016-05-09 LAB — CBC
HCT: 25 % — ABNORMAL LOW (ref 40.0–52.0)
Hemoglobin: 8.3 g/dL — ABNORMAL LOW (ref 13.0–18.0)
MCH: 29.9 pg (ref 26.0–34.0)
MCHC: 33.3 g/dL (ref 32.0–36.0)
MCV: 89.6 fL (ref 80.0–100.0)
PLATELETS: 452 10*3/uL — AB (ref 150–440)
RBC: 2.79 MIL/uL — ABNORMAL LOW (ref 4.40–5.90)
RDW: 14.4 % (ref 11.5–14.5)
WBC: 12.8 10*3/uL — AB (ref 3.8–10.6)

## 2016-05-09 LAB — COMPREHENSIVE METABOLIC PANEL
ALT: 80 U/L — AB (ref 17–63)
AST: 74 U/L — AB (ref 15–41)
Albumin: 2.8 g/dL — ABNORMAL LOW (ref 3.5–5.0)
Alkaline Phosphatase: 103 U/L (ref 38–126)
Anion gap: 12 (ref 5–15)
BILIRUBIN TOTAL: 0.4 mg/dL (ref 0.3–1.2)
BUN: 77 mg/dL — AB (ref 6–20)
CHLORIDE: 101 mmol/L (ref 101–111)
CO2: 19 mmol/L — ABNORMAL LOW (ref 22–32)
CREATININE: 3.97 mg/dL — AB (ref 0.61–1.24)
Calcium: 8 mg/dL — ABNORMAL LOW (ref 8.9–10.3)
GFR calc Af Amer: 14 mL/min — ABNORMAL LOW (ref >60)
GFR, EST NON AFRICAN AMERICAN: 12 mL/min — AB (ref >60)
Glucose, Bld: 249 mg/dL — ABNORMAL HIGH (ref 65–99)
Potassium: 4.8 mmol/L (ref 3.5–5.1)
Sodium: 132 mmol/L — ABNORMAL LOW (ref 135–145)
TOTAL PROTEIN: 6.3 g/dL — AB (ref 6.5–8.1)

## 2016-05-09 LAB — TROPONIN I: TROPONIN I: 0.03 ng/mL — AB (ref <0.03)

## 2016-05-09 LAB — LIPASE, BLOOD: Lipase: 35 U/L (ref 11–51)

## 2016-05-09 MED ORDER — SODIUM CHLORIDE 0.9 % IV BOLUS (SEPSIS)
1000.0000 mL | Freq: Once | INTRAVENOUS | Status: AC
  Administered 2016-05-09: 1000 mL via INTRAVENOUS

## 2016-05-09 MED ORDER — ONDANSETRON HCL 4 MG/2ML IJ SOLN
4.0000 mg | Freq: Once | INTRAMUSCULAR | Status: AC
  Administered 2016-05-09: 4 mg via INTRAVENOUS
  Filled 2016-05-09: qty 2

## 2016-05-09 MED ORDER — ONDANSETRON 4 MG PO TBDP: 4.0000 mg | ORAL_TABLET | Freq: Three times a day (TID) | ORAL | 0 refills | Status: AC | PRN

## 2016-05-09 NOTE — ED Triage Notes (Signed)
Pt from [facility] via EMS, staff reports abd pain, was negative for impaction per staff. Pt denies complaints. Pt at baseline per staff

## 2016-05-09 NOTE — Consult Note (Signed)
Surgical Consultation  05/09/2016  Rodney Valencia is an 82 y.o. male.   CC: Nausea and a single emesis  HPI: This is a demented bedbound nursing home patient. Apparently the patient vomited one time and history is not been obtainable from the patient but he was transferred over from the nursing home for the emesis. I have spoken with the emergency room physician who asked me to see the patient for possible gallbladder disease as a CT scan suggested some fluid around the gallbladder. Patient is demented but definitely is able to answer that he is not having any abdominal pain at this time  Also of significance is the patient has a unrepaired acetabular fracture and apparently was chosen to not repair this due to his dementia.  Past Medical History:  Diagnosis Date  . CKD (chronic kidney disease) stage 4, GFR 15-29 ml/min (HCC)   . Diabetes mellitus without complication ([place])   . Hypertension   . PVD (peripheral vascular disease) ([place])   . Renal disorder     Past Surgical History:  Procedure Laterality Date  . none      Family History  Problem Relation Age of Onset  . Diabetes Mellitus II Mother     Social History:  reports that he has never smoked. He uses smokeless tobacco. He reports that he does not drink alcohol or use drugs.  Allergies: No Known Allergies  Medications reviewed.   Review of Systems:   Review of Systems  Unable to perform ROS: Dementia     Physical Exam:  BP 137/76   Pulse 85   Temp 100 F (37.8 C)   Resp (!) 28   Ht _0  (1.854 m)   Wt 150 lb (68 kg)   SpO2 100%   BMI 19.79 kg/m   Physical Exam  Constitutional: He is well-developed, well-nourished, and in no distress. No distress.  HENT:  Head: Normocephalic and atraumatic.  Eyes: Right eye exhibits no discharge. Left eye exhibits no discharge. No scleral icterus.  Cardiovascular: Normal rate and normal heart sounds.   Pulmonary/Chest: Effort normal. No respiratory distress. He has  wheezes. He has rales.  Abdominal: Soft. He exhibits no distension. There is no tenderness. There is no rebound and no guarding.  Negative Murphy sign clearly nontender abdomen  Musculoskeletal: He exhibits edema. He exhibits no tenderness.  Skin: Skin is warm and dry. No rash noted. He is not diaphoretic. No erythema.  Vitals reviewed.     Results for orders placed or performed during the hospital encounter of 05/09/16 (from the past 48 hour(s))  Lipase, blood     Status: None   Collection Time: 05/09/16  5:02 PM  Result Value Ref Range   Lipase 35 11 - 51 U/L  Comprehensive metabolic panel     Status: Abnormal   Collection Time: 05/09/16  5:02 PM  Result Value Ref Range   Sodium 132 (L) 135 - 145 mmol/L   Potassium 4.8 3.5 - 5.1 mmol/L   Chloride 101 101 - 111 mmol/L   CO2 19 (L) 22 - 32 mmol/L   Glucose, Bld 249 (H) 65 - 99 mg/dL   BUN 77 (H) 6 - 20 mg/dL   Creatinine, Ser 3.97 (H) 0.61 - 1.24 mg/dL   Calcium 8.0 (L) 8.9 - 10.3 mg/dL   Total Protein 6.3 (L) 6.5 - 8.1 g/dL   Albumin 2.8 (L) 3.5 - 5.0 g/dL   AST 74 (H) 15 - 41 U/L   ALT 80 (H)  17 - 63 U/L   Alkaline Phosphatase 103 38 - 126 U/L   Total Bilirubin 0.4 0.3 - 1.2 mg/dL   GFR calc non Af Amer 12 (L) >60 mL/min   GFR calc Af Amer 14 (L) >60 mL/min    Comment: (NOTE) The eGFR has been calculated using the CKD EPI equation. This calculation has not been validated in all clinical situations. eGFR's persistently <60 mL/min signify possible Chronic Kidney Disease.    Anion gap 12 5 - 15  CBC     Status: Abnormal   Collection Time: 05/09/16  5:02 PM  Result Value Ref Range   WBC 12.8 (H) 3.8 - 10.6 K/uL   RBC 2.79 (L) 4.40 - 5.90 MIL/uL   Hemoglobin 8.3 (L) 13.0 - 18.0 g/dL   HCT 25.0 (L) 40.0 - 52.0 %   MCV 89.6 80.0 - 100.0 fL   MCH 29.9 26.0 - 34.0 pg   MCHC 33.3 32.0 - 36.0 g/dL   RDW 14.4 11.5 - 14.5 %   Platelets 452 (H) 150 - 440 K/uL  Troponin I     Status: Abnormal   Collection Time: 05/09/16  5:02  PM  Result Value Ref Range   Troponin I 0.03 (HH) <0.03 ng/mL    Comment: CRITICAL RESULT CALLED TO, READ BACK BY AND VERIFIED WITH GRACE Ku [facility] 05/09/16 @ 1748  MLK   Urinalysis complete, with microscopic     Status: Abnormal   Collection Time: 05/09/16  5:18 PM  Result Value Ref Range   Color, Urine YELLOW (A) YELLOW   APPearance CLOUDY (A) CLEAR   Glucose, UA 50 (A) NEGATIVE mg/dL   Bilirubin Urine NEGATIVE NEGATIVE   Ketones, ur NEGATIVE NEGATIVE mg/dL   Specific Gravity, Urine 1.015 1.005 - 1.030   Hgb urine dipstick 3+ (A) NEGATIVE   pH 5.0 5.0 - 8.0   Protein, ur 100 (A) NEGATIVE mg/dL   Nitrite NEGATIVE NEGATIVE   Leukocytes, UA TRACE (A) NEGATIVE   RBC / HPF TOO NUMEROUS TO COUNT 0 - 5 RBC/hpf   WBC, UA 6-30 0 - 5 WBC/hpf   Bacteria, UA NONE SEEN NONE SEEN   Squamous Epithelial / LPF 0-5 (A) NONE SEEN   Mucous PRESENT    Amorphous Crystal PRESENT    Ct Renal Stone Study  Addendum Date: 05/09/2016   ADDENDUM REPORT: 05/09/2016 18:44 ADDENDUM: Study discussed by telephone with Dr. Eula Listen on 05/09/2016 at 1830 hours. Electronically Signed   By: Genevie Ann M.D.   On: 05/09/2016 18:44   Result Date: 05/09/2016 CLINICAL DATA:  82 year old male with generalized abdominal pain. Initial encounter. EXAM: CT ABDOMEN AND PELVIS WITHOUT CONTRAST TECHNIQUE: Multidetector CT imaging of the abdomen and pelvis was performed following the standard protocol without IV contrast. COMPARISON:  CT Abdomen and Pelvis 11/23/2015. Right hip series [id number]. FINDINGS: Lower chest: New moderate to large pericardial effusion since May. The effusion measures up to 2 cm along the non dependent heart border (series 7, image 1). Smaller trace layering pleural effusions also are new. However, the lung bases otherwise appears stable, with a calcified granulomata in the lingula and no confluent pulmonary opacity. No upper abdominal free air. Hepatobiliary: Negative noncontrast liver, but indistinct  appearance of the gallbladder wall which is more dilated than on the prior CT (series 7, image 28). Small dependent gallstones (image 33). Pancreas: Negative noncontrast pancreas. Spleen: Diminutive and negative spleen with tiny calcified granulomas. Adrenals/Urinary Tract: Negative noncontrast adrenal glands and kidneys (exophytic simple appearing  left renal cysts). Stomach/Bowel: Mild retained stool in the distal colon. Sigmoid diverticulosis without definite active inflammation. Redundant left colon without definite active inflammation. Negative transverse colon. Redundant hepatic flexure. Decompressed right colon. There is a small volume of fluid in the right gutter and along the inferior right liver tip. There is associated thickening of the right lateral Conal Foss show (series 7, image 40) it is difficult to exclude mild ascending colon wall thickening (same image). However, the inflammation tracks to the duodenum and cephalad toward the gallbladder (see above). Vascular/Lymphatic: Extensive Aortoiliac calcified atherosclerosis noted. Vasculature not otherwise evaluated in the absence of IV contrast. Reproductive: Negative. Other: Small volume pelvic free fluid. Mild presacral stranding. Decompressed and unremarkable urinary bladder. Musculoskeletal: Osteopenia. Severely comminuted fracture of the right acetabulum re- demonstrated. Interval mildly increased displacement of butterfly fragments. See coronal image 85. Despite this, the proximal right femur remains intact. Associated mildly displaced comminuted fracture of the right inferior pubic ramus. Associated intramuscular hematoma about the right hip comment tracking cephalad in the right iliacus muscle. IMPRESSION: 1. Moderate pericardial effusion, new since 11/23/2015. Trace layering pleural effusions. 2. Small volume free fluid an inflammatory stranding along the right gutter and in the pelvis. Some of the pelvic findings could be related to #3, but the  main differential considerations in the right abdomen include acute cholecystitis and less likely acute ascending colitis. Recommend follow-up Right Upper Quadrant Ultrasound. 3. Severely comminuted right acetabular fracture, present since 04/25/2016, with surrounding intramuscular hematoma. Comminuted minimally displaced right inferior pubic ramus fracture. Proximal right femur remains intact. Electronically Signed: By: Genevie Ann M.D. On: 05/09/2016 18:24   US Abdomen Limited Ruq  Result Date: 05/09/2016 CLINICAL DATA:  Vomiting. EXAM: US ABDOMEN LIMITED - RIGHT UPPER QUADRANT COMPARISON:  CT 05/09/2016 FINDINGS: Gallbladder: Numerous stones and sludge identified within the gallbladder. Within the gallbladder neck pain non mobile stones measures 1.1 cm. Gallbladder wall is thickened, 7.1 mm. There is pericholecystic fluid. No sonographic Murphy's sign. Common bile duct: Diameter: 3.1 -6.5 mm Liver: No focal lesion identified. Within normal limits in parenchymal echogenicity. Small amount of fluid around the lower portion of the liver. IMPRESSION: 1. Gallbladder wall thickening and pericholecystic fluid. 2. Numerous gallstones; non mobile gallstone within the gallbladder neck. 3. No sonographic Murphy sign. 4. Small amount of ascites. Electronically Signed   By: Nolon Nations M.D.   On: 05/09/2016 19:26    Assessment/Plan:  Studies and labs personally reviewed. No evidence that this patient has significant acute cholecystitis. His Percell Miller sign is negative and he is completely nontender in his abdomen. I would not recommend any sort of surgical intervention. He has had 1 emesis and a trial of fluids is being attempted by the emergency room physician. While the patient has acetabular fracture that is been elected not to repair I would not perform any surgical intervention if he were to ever develop acute cholecystitis and pain and nausea were intractable (it is not at this point) then I would recommend a  cholecystostomy tube only. No need for further surgical follow-up at this time  Florene Glen, MD, FACS

## 2016-05-09 NOTE — ED Notes (Signed)
Pt tolerated PO fluid without difficulty 

## 2016-05-09 NOTE — Discharge Instructions (Signed)
Please follow a low fat, high fiber diet to prevent pain from gallstones. If Mr. Rodney Valencia develops nausea, you may give him Zofran. He may have Tylenol for pain.  Today, Mr. Rodney Valencia was diagnosed with both gallbladder disease as well as a pericardial effusion. No further workup was pursued in the emergency department after speaking with his POA who did not want any additional interventions. If the patient develops worsening symptoms, including chest pain, shortness of breath, lightheadedness or fainting, fever, inability to keep down fluids, or severe abdominal pain, leaves return to the emergency department and our speak to Mr. Rodney Valencia about his goals of care.

## 2016-05-09 NOTE — ED Notes (Signed)
Report given to [facility] nurse

## 2016-05-09 NOTE — ED Notes (Signed)
Patient transported to Ultrasound 

## 2016-05-09 NOTE — ED Provider Notes (Signed)
[facility] Emergency Department Provider Note  ____________________________________________  Time seen: Approximately 5:48 PM  I have reviewed the triage vital signs and the nursing notes.   HISTORY  Chief Complaint Abdominal Pain  Street is limited due to advanced dementia and patient does not answer any questions except yes or no questions  HPI Rodney Valencia is a 82 y.o. male with a history of dementia brought to the emergency department for reporting abdominal pain with a single episode of vomiting or to arrival. There is no history of fever, constipation or diarrhea. The patient is unable to give me any history, but states that he is not having any pain at this time.   Past Medical History:  Diagnosis Date  . CKD (chronic kidney disease) stage 4, GFR 15-29 ml/min (HCC)   . Diabetes mellitus without complication (HCC)   . Hypertension   . PVD (peripheral vascular disease) (HCC)   . Renal disorder     Patient Active Problem List   Diagnosis Date Noted  . Vomiting   . SIRS (systemic inflammatory response syndrome) (HCC) 02/28/2016    Past Surgical History:  Procedure Laterality Date  . none      Current Outpatient Rx  . Order #: [id number] Class: Historical Med  . Order #: [id number] Class: Historical Med  . Order #: [id number] Class: Historical Med  . Order #: [id number] Class: Historical Med  . Order #: [id number] Class: Historical Med  . Order #: [id number] Class: Historical Med  . Order #: [id number] Class: Historical Med  . Order #: [id number] Class: Historical Med  . Order #: [id number] Class: Historical Med  . Order #: [id number] Class: Historical Med  . Order #: [id number] Class: Historical Med  . Order #: [id number] Class: Historical Med  . Order #: [id number] Class: Historical Med  . Order #: [id number] Class: Historical Med  . Order #: [id number] Class: Historical Med  . Order #: [id number] Class: Historical Med  . Order #: [id number] Class: Historical Med   . Order #: [id number] Class: Historical Med  . Order #: [id number] Class: Print    Allergies Review of patient's allergies indicates no known allergies.  Family History  Problem Relation Age of Onset  . Diabetes Mellitus II Mother     Social History Social History  Substance Use Topics  . Smoking status: Never Smoker  . Smokeless tobacco: Current User  . Alcohol use No    Review of Systems Constitutional: No fever/chills. Eyes:Unable to assess ENT: . No congestion or rhinorrhea. Cardiovascular: Denies chest pain. Denies palpitations. Respiratory: Denies shortness of breath.  No cough. Gastrointestinal: No abdominal pain at the time of my examination.  Positive nausea, positive vomiting.  No diarrhea.  No constipation. Genitourinary: Unable to assess Musculoskeletal: Unable to assess Skin: Negative for rash. Neurological: Unable to assess   10-point ROS otherwise negative.  ____________________________________________   PHYSICAL EXAM:  VITAL SIGNS: ED Triage Vitals  Enc Vitals Group     BP 05/09/16 1654 138/74     Pulse Rate 05/09/16 1655 90     Resp 05/09/16 1654 (!) 26     Temp 05/09/16 1655 100 F (37.8 C)     Temp src --      SpO2 05/09/16 1655 93 %     Weight 05/09/16 1653 150 lb (68 kg)     Height 05/09/16 1653 6\' 1"  (1.854 m)     Head Circumference --      Peak Flow --      Pain Score 05/09/16 1654 0     Pain Loc --  Pain Edu? --      Excl. in GC? --     Constitutional:Patient is alert to person only. He is able to answer some basic yes or no questions. He is in no acute distress and nontoxic.  Eyes: Conjunctivae are normal.  EOMI. No scleral icterus. Head: Atraumatic. Nose: No congestion/rhinnorhea. Mouth/Throat: Mucous membranes are dry.  Neck: No stridor.  Supple.   Cardiovascular: Normal rate, regular rhythm. No murmurs, rubs or gallops.  Respiratory: Normal respiratory effort.  No accessory muscle use or retractions. Lungs CTAB.  No  wheezes, rales or ronchi. Gastrointestinal: Soft, mildly distended and nontender in all 4 quadrants.  No guarding or rebound.  No peritoneal signs. GU: Wears a diaper Musculoskeletal: No LE edema.  Neurologic:  Alert and protecting his airway.  Speech is clear.  Face and smile are symmetric.  EOMI.  Moves all extremities well. Skin:  Skin is warm, dry and intact. No rash noted. Psychiatric: Mood and affect are normal. Speech and behavior are normal.  Normal judgement.  ____________________________________________   LABS (all labs ordered are listed, but only abnormal results are displayed)  Labs Reviewed  COMPREHENSIVE METABOLIC PANEL - Abnormal; Notable for the following:       Result Value   Sodium 132 (*)    CO2 19 (*)    Glucose, Bld 249 (*)    BUN 77 (*)    Creatinine, Ser 3.97 (*)    Calcium 8.0 (*)    Total Protein 6.3 (*)    Albumin 2.8 (*)    AST 74 (*)    ALT 80 (*)    GFR calc non Af Amer 12 (*)    GFR calc Af Amer 14 (*)    All other components within normal limits  CBC - Abnormal; Notable for the following:    WBC 12.8 (*)    RBC 2.79 (*)    Hemoglobin 8.3 (*)    HCT 25.0 (*)    Platelets 452 (*)    All other components within normal limits  URINALYSIS COMPLETEWITH MICROSCOPIC ([facility] ONLY) - Abnormal; Notable for the following:    Color, Urine YELLOW (*)    APPearance CLOUDY (*)    Glucose, UA 50 (*)    Hgb urine dipstick 3+ (*)    Protein, ur 100 (*)    Leukocytes, UA TRACE (*)    Squamous Epithelial / LPF 0-5 (*)    All other components within normal limits  TROPONIN I - Abnormal; Notable for the following:    Troponin I 0.03 (*)    All other components within normal limits  LIPASE, BLOOD   ____________________________________________  EKG  ED ECG REPORT I, Rockne MenghiniNorman, Anne-Caroline, the attending physician, personally viewed and interpreted this ECG.   Date: 05/09/2016  EKG Time: 1656  Rate: 87  Rhythm: normal sinus rhythm  Axis: normal   Intervals:none  ST&T Change: No STEMI  ____________________________________________  RADIOLOGY  Ct Renal Stone Study  Addendum Date: 05/09/2016   ADDENDUM REPORT: 05/09/2016 18:44 ADDENDUM: Study discussed by telephone with Dr. Rockne MenghiniANNE-CAROLINE Iliyah Bui on 05/09/2016 at 1830 hours. Electronically Signed   By: Odessa FlemingH  Hall M.D.   On: 05/09/2016 18:44   Result Date: 05/09/2016 CLINICAL DATA:  82 year old male with generalized abdominal pain. Initial encounter. EXAM: CT ABDOMEN AND PELVIS WITHOUT CONTRAST TECHNIQUE: Multidetector CT imaging of the abdomen and pelvis was performed following the standard protocol without IV contrast. COMPARISON:  CT Abdomen and Pelvis 11/23/2015. Right hip series 101917. FINDINGS: Lower  chest: New moderate to large pericardial effusion since May. The effusion measures up to 2 cm along the non dependent heart border (series 7, image 1). Smaller trace layering pleural effusions also are new. However, the lung bases otherwise appears stable, with a calcified granulomata in the lingula and no confluent pulmonary opacity. No upper abdominal free air. Hepatobiliary: Negative noncontrast liver, but indistinct appearance of the gallbladder wall which is more dilated than on the prior CT (series 7, image 28). Small dependent gallstones (image 33). Pancreas: Negative noncontrast pancreas. Spleen: Diminutive and negative spleen with tiny calcified granulomas. Adrenals/Urinary Tract: Negative noncontrast adrenal glands and kidneys (exophytic simple appearing left renal cysts). Stomach/Bowel: Mild retained stool in the distal colon. Sigmoid diverticulosis without definite active inflammation. Redundant left colon without definite active inflammation. Negative transverse colon. Redundant hepatic flexure. Decompressed right colon. There is a small volume of fluid in the right gutter and along the inferior right liver tip. There is associated thickening of the right lateral Conal Foss show (series 7,  image 40) it is difficult to exclude mild ascending colon wall thickening (same image). However, the inflammation tracks to the duodenum and cephalad toward the gallbladder (see above). Vascular/Lymphatic: Extensive Aortoiliac calcified atherosclerosis noted. Vasculature not otherwise evaluated in the absence of IV contrast. Reproductive: Negative. Other: Small volume pelvic free fluid. Mild presacral stranding. Decompressed and unremarkable urinary bladder. Musculoskeletal: Osteopenia. Severely comminuted fracture of the right acetabulum re- demonstrated. Interval mildly increased displacement of butterfly fragments. See coronal image 85. Despite this, the proximal right femur remains intact. Associated mildly displaced comminuted fracture of the right inferior pubic ramus. Associated intramuscular hematoma about the right hip comment tracking cephalad in the right iliacus muscle. IMPRESSION: 1. Moderate pericardial effusion, new since 11/23/2015. Trace layering pleural effusions. 2. Small volume free fluid an inflammatory stranding along the right gutter and in the pelvis. Some of the pelvic findings could be related to #3, but the main differential considerations in the right abdomen include acute cholecystitis and less likely acute ascending colitis. Recommend follow-up Right Upper Quadrant Ultrasound. 3. Severely comminuted right acetabular fracture, present since 04/25/2016, with surrounding intramuscular hematoma. Comminuted minimally displaced right inferior pubic ramus fracture. Proximal right femur remains intact. Electronically Signed: By: Odessa FlemingH  Hall M.D. On: 05/09/2016 18:24   [place] Abdomen Limited Ruq  Result Date: 05/09/2016 CLINICAL DATA:  Vomiting. EXAM: US ABDOMEN LIMITED - RIGHT UPPER QUADRANT COMPARISON:  CT 05/09/2016 FINDINGS: Gallbladder: Numerous stones and sludge identified within the gallbladder. Within the gallbladder neck pain non mobile stones measures 1.1 cm. Gallbladder wall is thickened,  7.1 mm. There is pericholecystic fluid. No sonographic Murphy's sign. Common bile duct: Diameter: 3.1 -6.5 mm Liver: No focal lesion identified. Within normal limits in parenchymal echogenicity. Small amount of fluid around the lower portion of the liver. IMPRESSION: 1. Gallbladder wall thickening and pericholecystic fluid. 2. Numerous gallstones; non mobile gallstone within the gallbladder neck. 3. No sonographic Murphy sign. 4. Small amount of ascites. Electronically Signed   By: Norva PavlovElizabeth  Brown M.D.   On: 05/09/2016 19:26    ____________________________________________   PROCEDURES  Procedure(s) performed: None  Procedures  Critical Care performed: No ____________________________________________   INITIAL IMPRESSION / ASSESSMENT AND PLAN / ED COURSE  Pertinent labs & imaging results that were available during my care of the patient were reviewed by me and considered in my medical decision making (see chart for details).  82 y.o. male with dementia who is unable to give a history presenting with a report  of abdominal pain with one episode of nausea and vomiting earlier today. Overall, the patient has stable vital signs and is afebrile. He is a mildly distended abdomen with no evidence of pain. His laboratory studies from triage are concerning for elevated white blood cell count and hematuria without any bacteria or nitrites. He has mild hyponatremia and a chronic renal insufficiency which is unchanged. He is hyperglycemic without DKA. I will evaluate the patient for renal stone, consider cystitis although this is less likely with no bacteria or nitrites. Perhaps the patient had a vomiting episode from a meal earlier today. Consider appendicitis although there are no focal findings on examination. Consider obstruction as well. The patient does not give any history of chest pain and has a troponin of 0.03; he had a similar troponin on 10/19, and is not having any ischemic changes on his EKG. I  do not think the patient has an acute MI or ongoing ACS.  ----------------------------------------- 6:33 PM on 05/09/2016 -----------------------------------------  I have been called by the radiologist for multiple findings on the CT scan which will be available in an official report soon.  The patient has an old acetabular fracture; this is known and the pt was presumably not a surgical candidate.  He also has fluid and stranding around the liver and right colon, with gallstones, concerning for possibly cholcystitis; I have ordered a RUQ u/s for further eval.  Pt also has a new 2cm pericardial effusion; there is no evidence of tamponade on his EKG or clinically with his vital signs, but this will require inpatient evaluation as it was not present on recent imaging.  ----------------------------------------- 7:40 PM on 05/09/2016 -----------------------------------------  The patient does have a thickened gallbladder wall with some pericholecystic fluid as well as several gallstones. At this time, the patient continues to be symptom free, afebrile. I spoke with Dr. Excell Seltzer, who will come evaluate the patient. Given our understanding of this patient's healthcare, it is unlikely that he is a candidate for surgery. After Dr. Excell Seltzer sees the patient, we will do a by mouth challenge, and call the patient's POA for final disposition. ____________________________________________  FINAL CLINICAL IMPRESSION(S) / ED DIAGNOSES  Final diagnoses:  Abdominal pain  Vomiting  Pericardial effusion  Hematuria, unspecified type  Calculus of gallbladder without cholecystitis without obstruction    Clinical Course  Comment By Time  The patient continues to be hemodynamically stable. At this time, he continues to be chest pain-free, afebrile, and is able to tolerate liquid.  I've spoken with Dr. Excell Seltzer who feels that the patient's presentation is more consistent with cholelithiasis then with cholecystitis. His  other medical conditions also make him a suboptimal surgical candidate. As the patient is symptom-free, able to eat and drink without vomiting, has a slight elevation in his white blood cell count but is afebrile, Dr. Earmon Phoenix recommendation would be outpatient evaluation for elective surgery versus no action at this time.  The patient does have a pericardial effusion without EKG changes or hemodynamic instability.  At this time, I have spoken with Lorelee Cover, who is the patient's power of attorney. I have discussed both the gallbladder condition in the pericardial effusion, including the possibility of symptoms related to both, worsening conditions including arrhythmia, cardiopulmonary arrest, or sepsis. At this time he feels that the patient would not want any invasive procedures, so additional workup including echocardiogram, is not warranted. Lorelee Cover is recommending discharge at this time. I will plan to discharge the patient back to his  nursing home, with close PMD follow-up. I will discharge him with dietary recommendations as well as a prescription for Zofran. Rockne Menghini, MD 11/02 2034      NEW MEDICATIONS STARTED DURING THIS VISIT:  Discharge Medication List as of 05/09/2016  8:40 PM        Rockne Menghini, MD 05/09/16 2336

## 2016-08-08 DEATH — deceased

## 2018-01-11 IMAGING — CT CT HEAD W/O CM
4 series · 17 of 47 positions shown, 19 images · non-contrast
Comparison: Head CT dated 03/01/2016

CLINICAL DATA: [AGE] male with fall

EXAM:
CT HEAD WITHOUT CONTRAST
TECHNIQUE: Contiguous axial images were obtained from the base of the skull
through the vertex without intravenous contrast.

[Series 2: head bone · axial · 0.43mm/px · z∈[-65,-11]mm · 4 of 78 slices shown]
[im 8/78  bone]
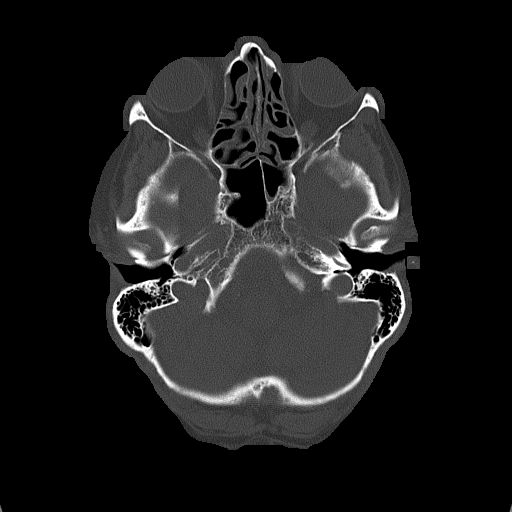
[im 16/78  bone]
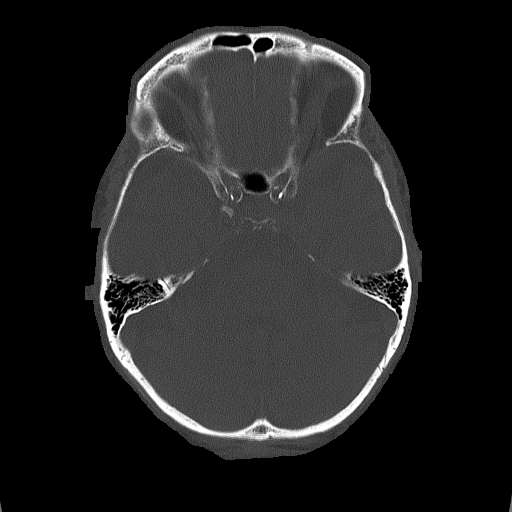
[im 24/78  bone]
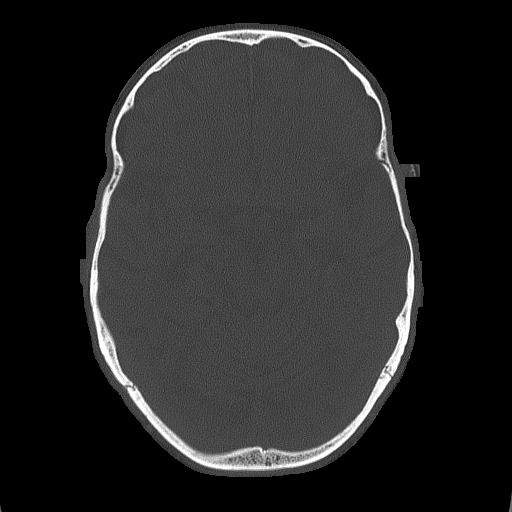
[im 35/78  bone]
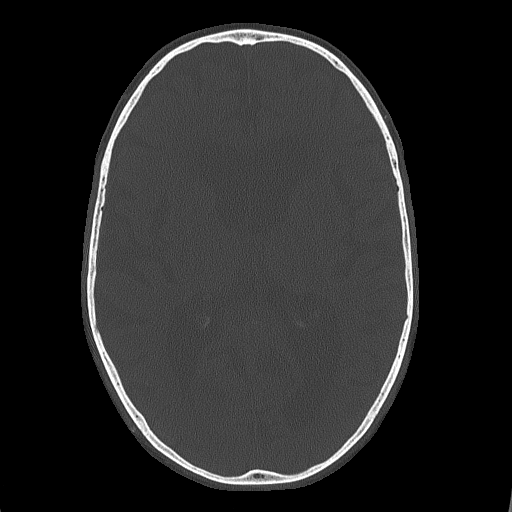

[Series 3: coronal soft tissue · coronal · 0.32mm/px · 3 of 65 slices shown]
[im 22/65  brain]
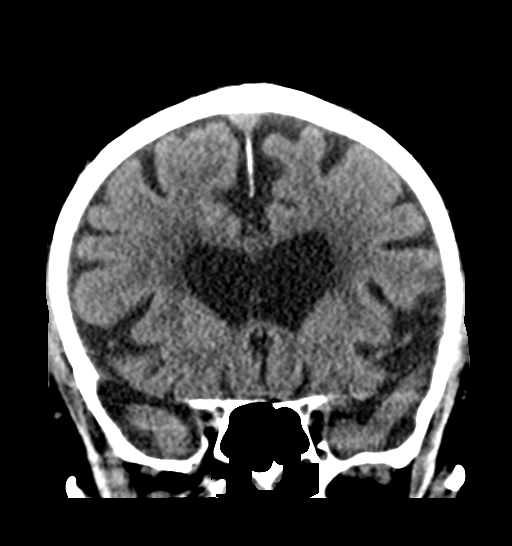
[im 29/65  brain]
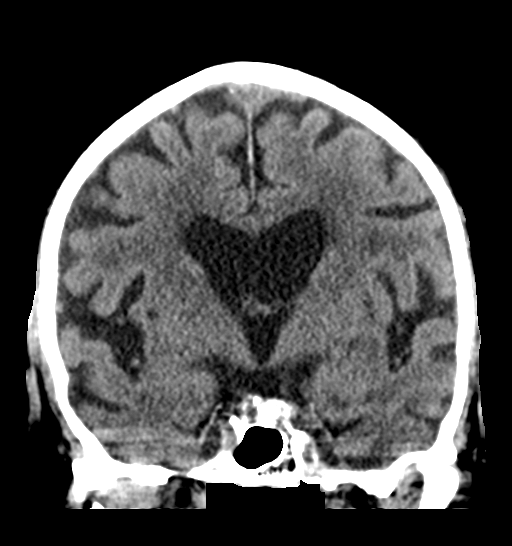
[im 36/65  brain]
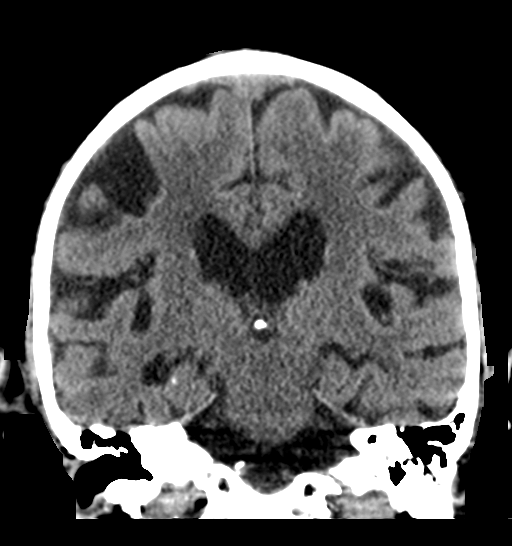

[Series 4: sagittal soft tissue · sagittal · 0.36mm/px · 3 of 54 slices shown]
[im 18/54  brain]
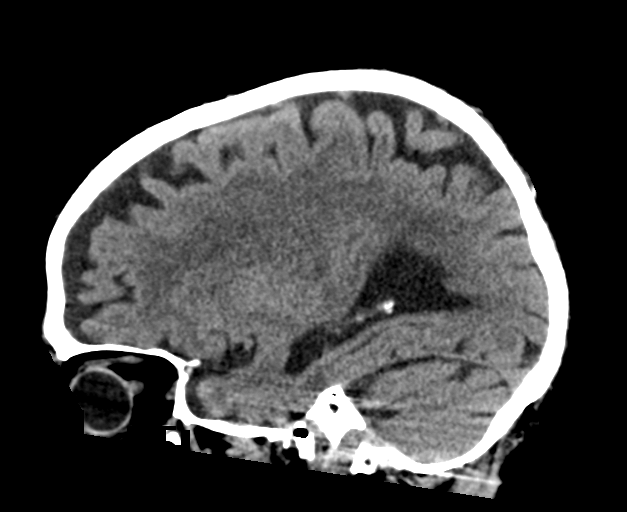
[im 27/54  brain]
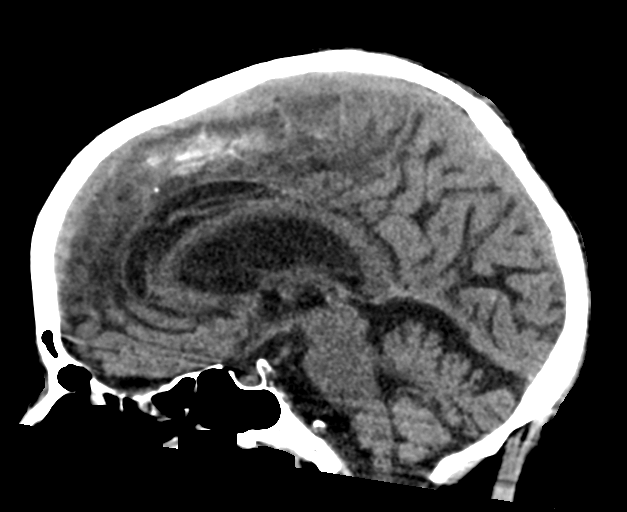
[im 36/54  brain]
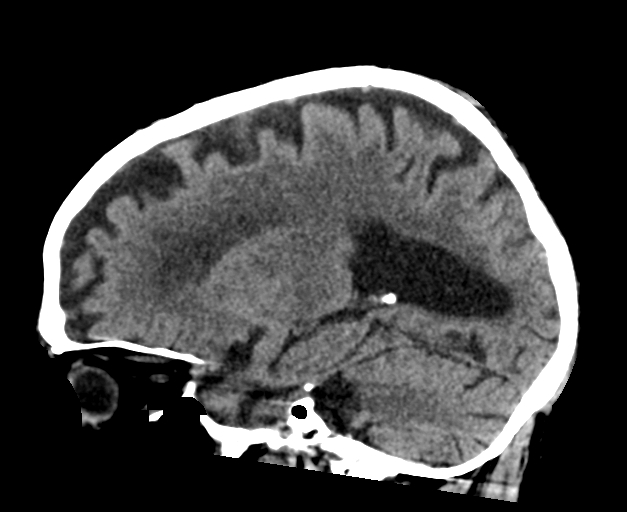

[Series 5: head wo · axial · 0.43mm/px · z∈[-64,+56]mm · 7 of 32 slices shown, 9 images]
[im 4/32  brain]
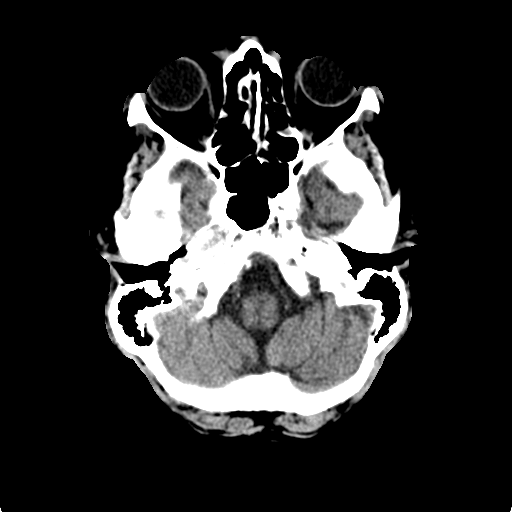
[im 4/32  bone]
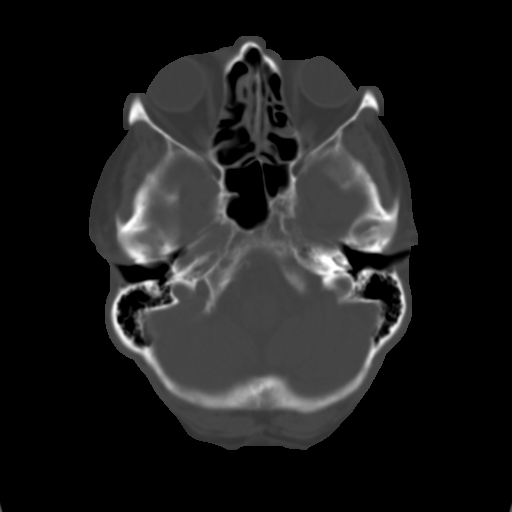
[im 8/32  brain]
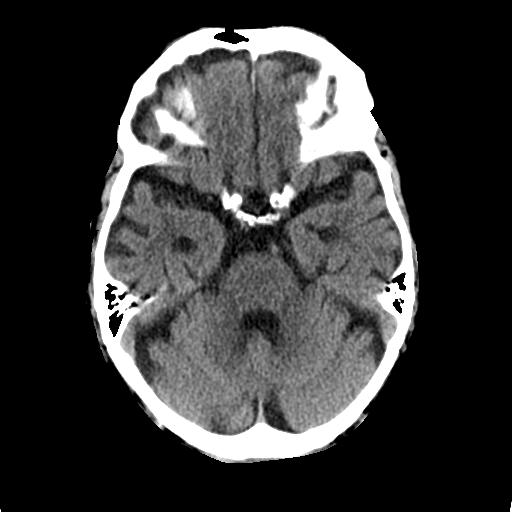
[im 12/32  brain]
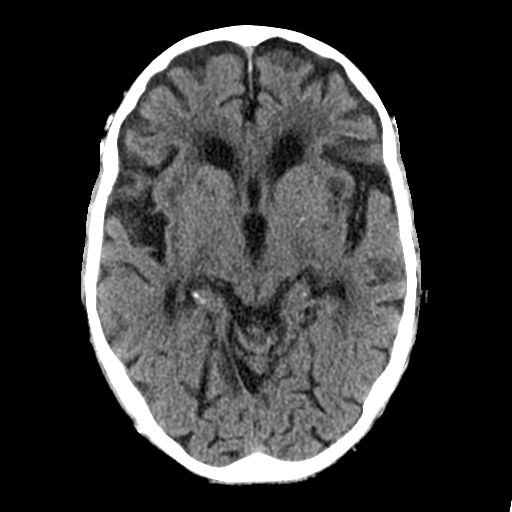
[im 16/32  brain]
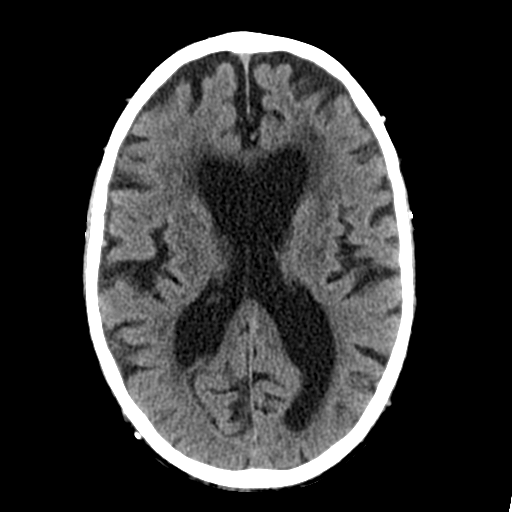
[im 20/32  brain]
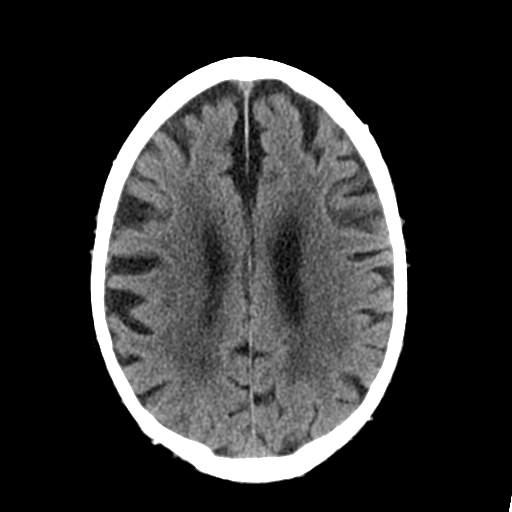
[im 20/32  bone]
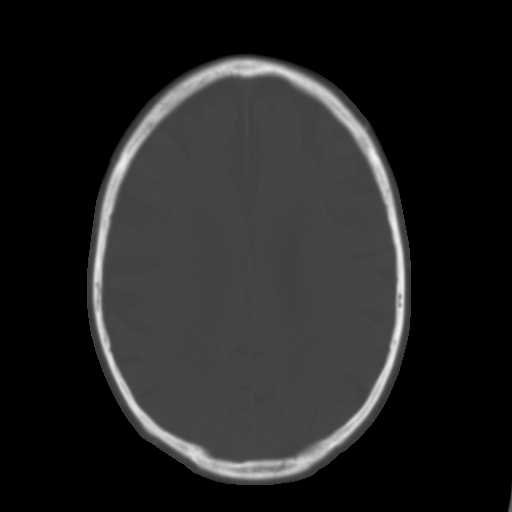
[im 24/32  brain]
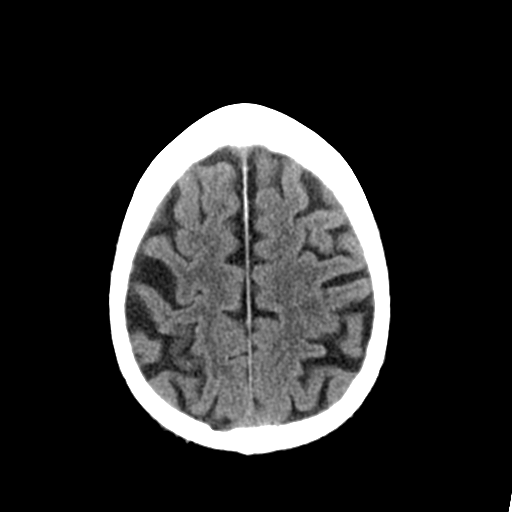
[im 28/32  brain]
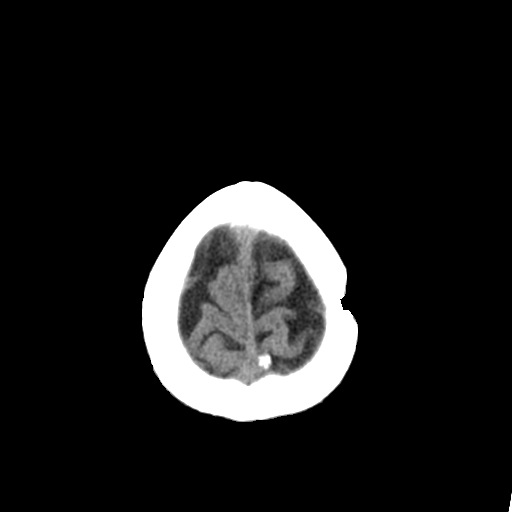

[17 of 47 positions shown; findings below may reference images not displayed]

FINDINGS: Brain: There is moderate age-related atrophy and chronic
microvascular ischemic changes. There is no acute intracranial
hemorrhage. No mass effect or midline shift noted. No extra-axial
fluid collections.

Vascular: No hyperdense vessel or unexpected calcification.

Skull: Normal. Negative for fracture or focal lesion.

Sinuses/Orbits: There is mild mucoperiosteal thickening of paranasal
sinuses. No air-fluid levels. The mastoid air cells are clear.

Other: None
IMPRESSION: No acute intracranial hemorrhage.

Age-related atrophy and chronic microvascular ischemic disease.

## 2018-01-11 IMAGING — CR DG HIP (WITH OR WITHOUT PELVIS) 2-3V*R*
4 series · 4 of 4 positions shown · non-contrast
Comparison: None.

CLINICAL DATA: [AGE] male with fall and right hip pain.

EXAM:
DG HIP (WITH OR WITHOUT PELVIS) 2-3V RIGHT

[pelvis ap]
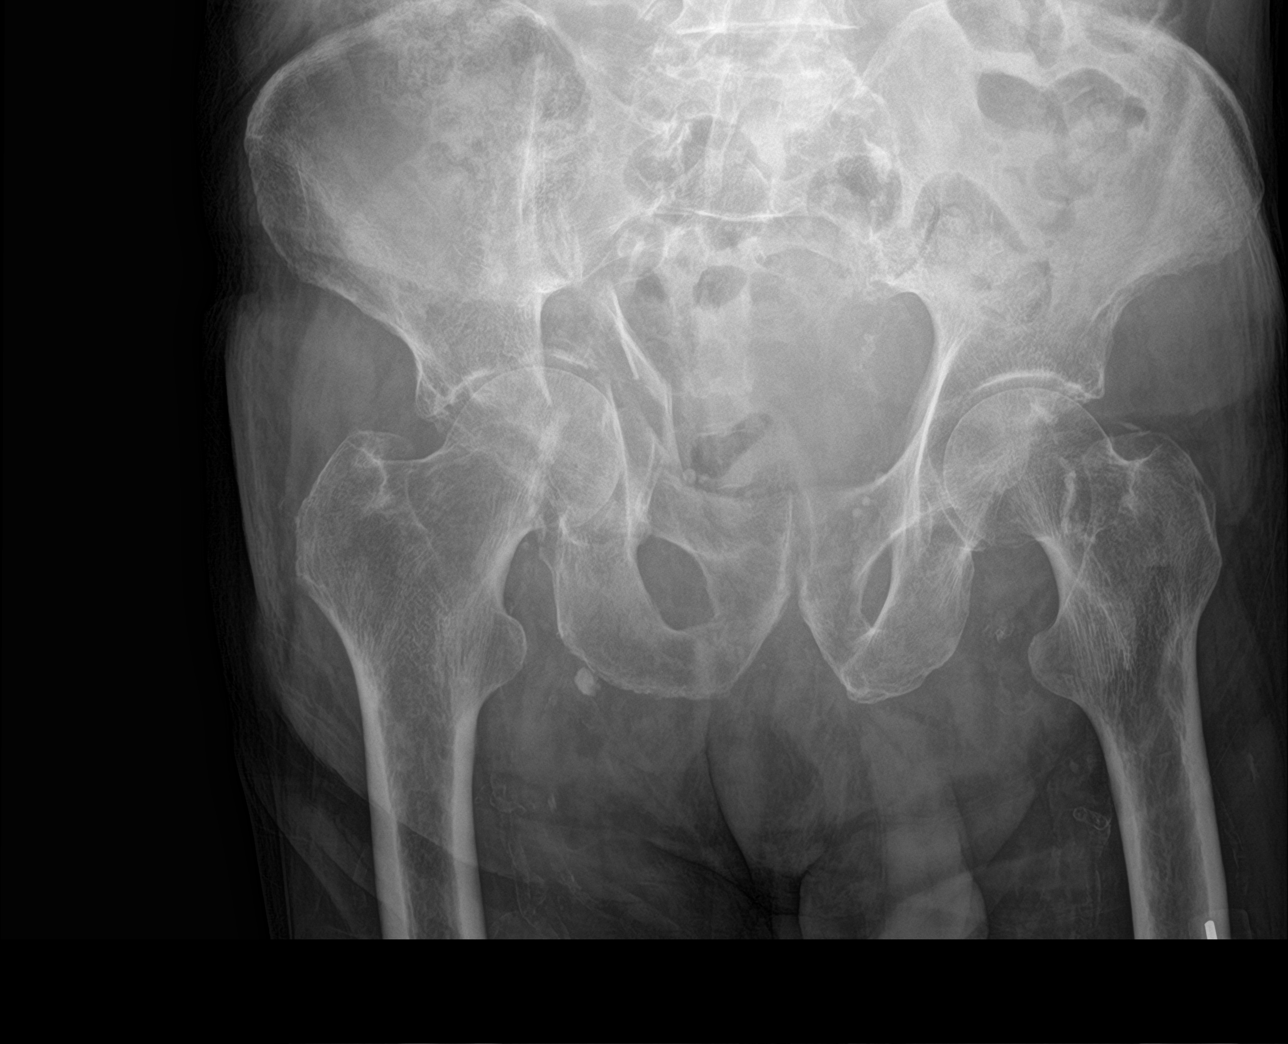

[hip ap]
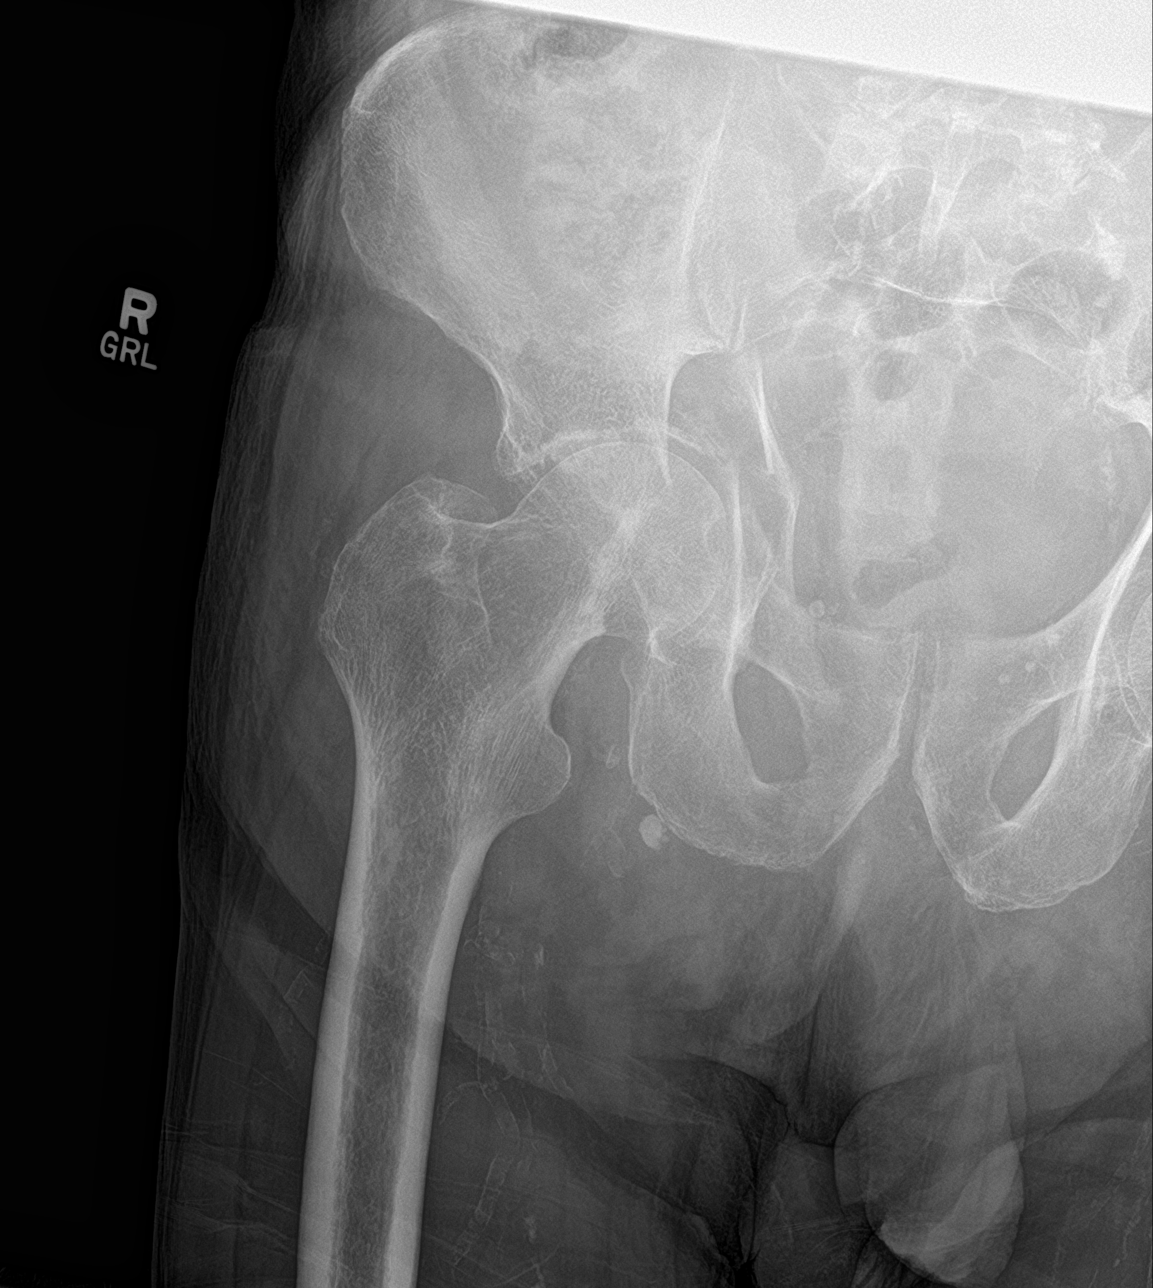

[hip lat (1 of 2)]
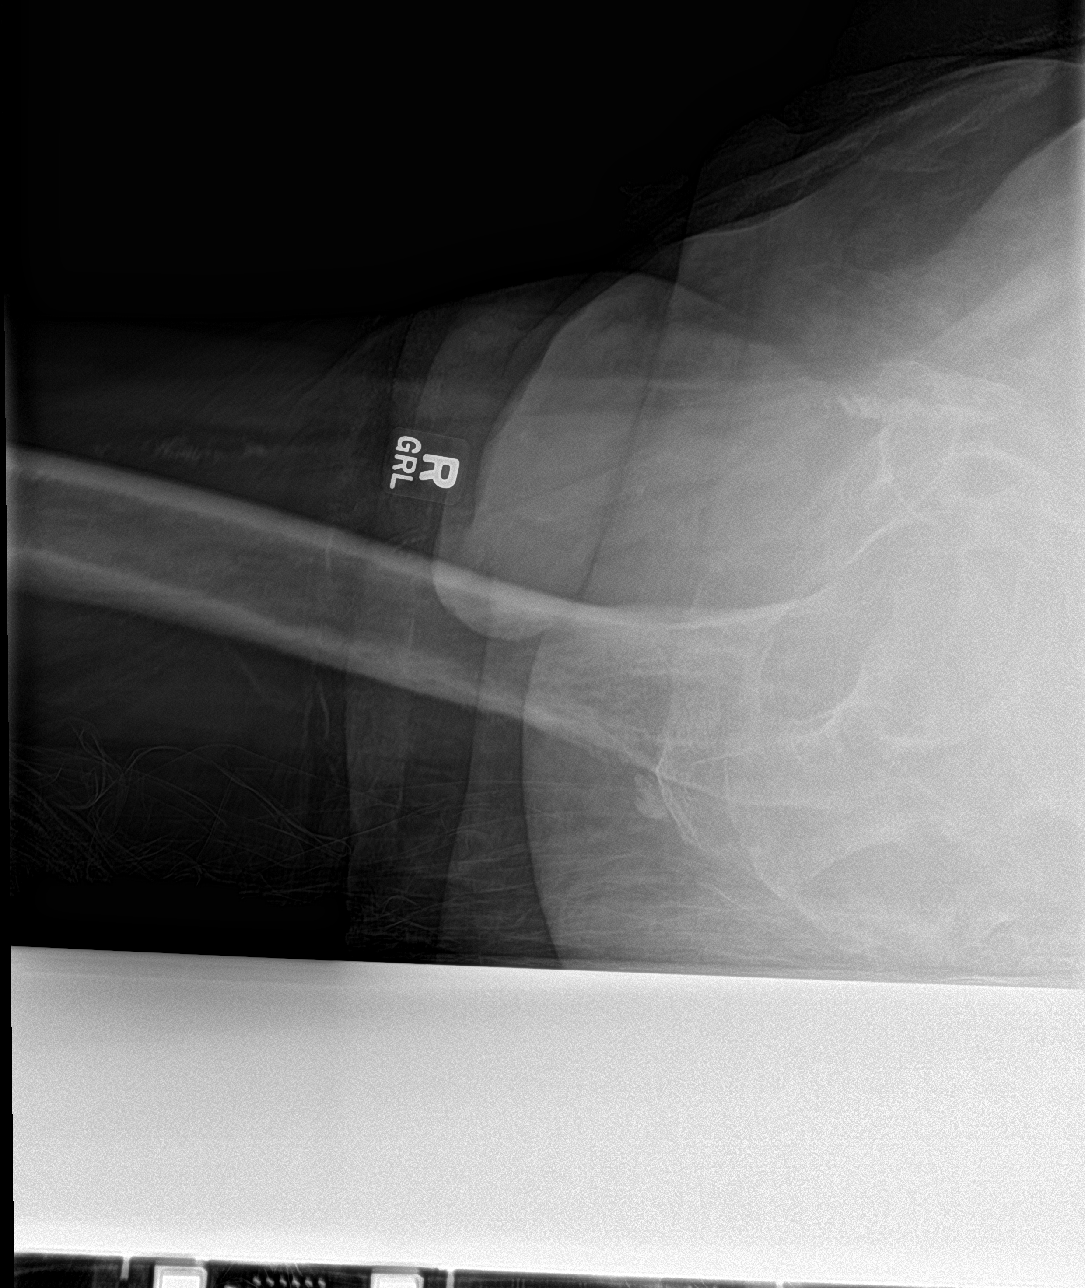

[hip lat (2 of 2)]
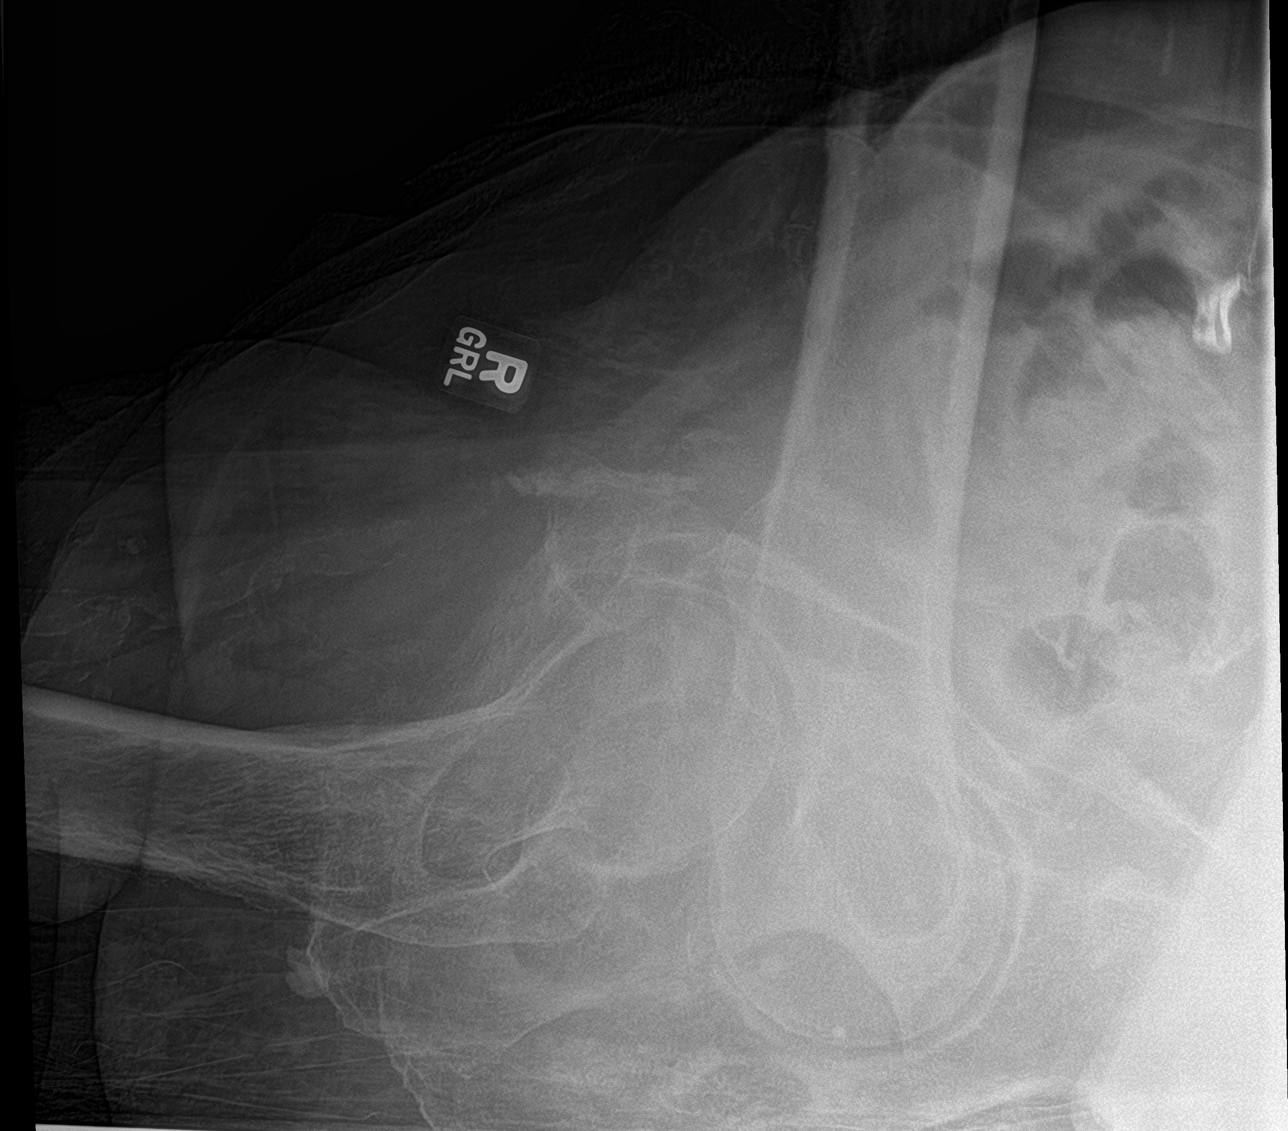

[4 of 4 positions shown; findings below may reference images not displayed]

FINDINGS: There is comminuted fracture of the medial wall of the right
acetabulum and right pubic bone with medial displacement and
protrusion. No definite femoral fracture identified. The bones are
osteopenic. There is no dislocation. The soft tissues appear
unremarkable.
IMPRESSION: Comminuted and displaced fracture of the medial wall of the right
acetabulum and right pubic bone. No definite femoral fracture. No
dislocation. CT may provide better evaluation.
